# Patient Record
Sex: Male | Born: 1980 | Race: White | Hispanic: No | Marital: Single | Smoking: Current every day smoker
Health system: Southern US, Community
[De-identification: ages and names within clinical notes are randomized; demographics above are authoritative.]

## PROBLEM LIST (undated history)

## (undated) DIAGNOSIS — Z8719 Personal history of other diseases of the digestive system: Secondary | ICD-10-CM

## (undated) DIAGNOSIS — Z22322 Carrier or suspected carrier of Methicillin resistant Staphylococcus aureus: Secondary | ICD-10-CM

## (undated) DIAGNOSIS — Z8711 Personal history of peptic ulcer disease: Secondary | ICD-10-CM

## (undated) DIAGNOSIS — A4902 Methicillin resistant Staphylococcus aureus infection, unspecified site: Secondary | ICD-10-CM

## (undated) HISTORY — PX: FRACTURE SURGERY: SHX138

## (undated) HISTORY — PX: LEG SURGERY: SHX1003

## (undated) HISTORY — PX: STOMACH SURGERY: SHX791

---
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
## 2008-02-15 ENCOUNTER — Emergency Department (HOSPITAL_COMMUNITY): Admission: EM | Admit: 2008-02-15 | Discharge: 2008-02-15 | Payer: Self-pay | Admitting: Emergency Medicine

## 2008-09-05 ENCOUNTER — Emergency Department (HOSPITAL_COMMUNITY): Admission: EM | Admit: 2008-09-05 | Discharge: 2008-09-05 | Payer: Self-pay | Admitting: Emergency Medicine

## 2008-09-27 ENCOUNTER — Emergency Department (HOSPITAL_COMMUNITY): Admission: EM | Admit: 2008-09-27 | Discharge: 2008-09-27 | Payer: Self-pay | Admitting: Emergency Medicine

## 2010-10-29 ENCOUNTER — Emergency Department (HOSPITAL_COMMUNITY)
Admission: EM | Admit: 2010-10-29 | Discharge: 2010-10-29 | Disposition: A | Discharge status: Home / Self Care | Payer: Self-pay | Attending: Emergency Medicine | Admitting: Emergency Medicine

## 2010-10-29 DIAGNOSIS — L738 Other specified follicular disorders: Secondary | ICD-10-CM | POA: Insufficient documentation

## 2010-10-29 DIAGNOSIS — Z8614 Personal history of Methicillin resistant Staphylococcus aureus infection: Secondary | ICD-10-CM | POA: Insufficient documentation

## 2010-10-29 DIAGNOSIS — R21 Rash and other nonspecific skin eruption: Secondary | ICD-10-CM | POA: Insufficient documentation

## 2011-04-29 ENCOUNTER — Emergency Department (HOSPITAL_COMMUNITY)
Admission: EM | Admit: 2011-04-29 | Discharge: 2011-04-29 | Disposition: A | Discharge status: Home / Self Care | Payer: Self-pay | Attending: Emergency Medicine | Admitting: Emergency Medicine

## 2011-04-29 ENCOUNTER — Encounter: Payer: Self-pay | Admitting: Emergency Medicine

## 2011-04-29 DIAGNOSIS — K089 Disorder of teeth and supporting structures, unspecified: Secondary | ICD-10-CM | POA: Insufficient documentation

## 2011-04-29 DIAGNOSIS — R51 Headache: Secondary | ICD-10-CM | POA: Insufficient documentation

## 2011-04-29 DIAGNOSIS — F17200 Nicotine dependence, unspecified, uncomplicated: Secondary | ICD-10-CM | POA: Insufficient documentation

## 2011-04-29 DIAGNOSIS — K047 Periapical abscess without sinus: Secondary | ICD-10-CM | POA: Insufficient documentation

## 2011-04-29 DIAGNOSIS — R5381 Other malaise: Secondary | ICD-10-CM | POA: Insufficient documentation

## 2011-04-29 DIAGNOSIS — R5383 Other fatigue: Secondary | ICD-10-CM | POA: Insufficient documentation

## 2011-04-29 DIAGNOSIS — K0889 Other specified disorders of teeth and supporting structures: Secondary | ICD-10-CM

## 2011-04-29 MED ORDER — PENICILLIN V POTASSIUM 500 MG PO TABS: 500.0000 mg | ORAL_TABLET | Freq: Four times a day (QID) | ORAL | Status: AC

## 2011-04-29 MED ORDER — HYDROCODONE-ACETAMINOPHEN 5-325 MG PO TABS: 1.0000 | ORAL_TABLET | Freq: Four times a day (QID) | ORAL | Status: AC | PRN

## 2011-04-29 NOTE — ED Notes (Signed)
r side dental pain x 2 months. Worse x 2-3 days. Giving him headaches. No dental insurance in 2-3 wks.

## 2011-04-29 NOTE — ED Provider Notes (Signed)
Scribed for [name], MD, the patient was seen in room APFT22/APFT22 . This chart was scribed by Ellie Lunch. This patient's care was started at 4:57 PM.   CSN: [id number] Arrival date & time: 04/29/2011  4:39 PM  Chief Complaint  Patient presents with  . Dental Pain    (Consider location/radiation/quality/duration/timing/severity/associated sxs/prior treatment) HPI Gabriel Edwards is a 30 y.o. male who presents to the Emergency Department complaining of right upper molar dental pain starting 2 months ago and becoming progressively worse the past two days. Pain is described as constant and severe. C/o associated HA. Pain is  exacerbated by eating. Pt reports he can't eat b/c of overwhelming pain.  Denies nausea and vomiting. Pt reports he is 2-3 weeks away from dental insurance and 2-3 weeks away from seeing dentist.   History reviewed. No pertinent past medical history.  History reviewed. No pertinent past surgical history.  History reviewed. No pertinent family history.  History  Substance Use Topics  . Smoking status: Current Some Day Smoker  . Smokeless tobacco: Not on file  . Alcohol Use: No    Review of Systems  HENT: Positive for dental problem.   Neurological: Positive for weakness and headaches.  All other systems reviewed and are negative.   Allergies  Sulfa antibiotics  Home Medications   Current Outpatient Rx  Name Route Sig Dispense Refill  . IBUPROFEN 200 MG PO TABS Oral Take 200 mg by mouth as needed. For pain       BP 126/73  Pulse 65  Temp(Src) 97.5 F (36.4 C) (Oral)  Resp 16  Ht 6\' 1"  (1.854 m)  Wt 195 lb (88.451 kg)  BMI 25.73 kg/m2  SpO2 100%  Physical Exam  Nursing note and vitals reviewed. Constitutional: He is oriented to person, place, and time. He appears well-developed and well-nourished.  HENT:  Head: Normocephalic and atraumatic.  Mouth/Throat: Dental abscesses (Upper right molar abscessed and partially broken. )  present.  Eyes: Conjunctivae and EOM are normal. No scleral icterus.  Neck: Neck supple.  Cardiovascular: Normal rate and regular rhythm.   Pulmonary/Chest: Effort normal and breath sounds normal.  Abdominal: He exhibits no distension. There is no tenderness. There is no rebound.  Musculoskeletal: Normal range of motion. He exhibits no edema.  Lymphadenopathy:    He has no cervical adenopathy.  Neurological: He is oriented to person, place, and time. Coordination normal.  Skin: No rash noted. No erythema.  Psychiatric: He has a normal mood and affect. His behavior is normal.    Procedures (including critical care time)  OTHER DATA REVIEWED: Nursing notes, vital signs, and past medical records reviewed.  DIAGNOSTIC STUDIES: Oxygen Saturation is 100% on room air, normal by my interpretation.    ED COURSE /COORDINATION OF CARE: 5:05 PM EDP at PT bedside. Discussed plan to discharge with penicillin and hydrocodone.   MDM: toothache  SCRIBE ATTESTATIOn The chart was scribed for me under my direct supervision.  I personally performed the history, physical, and medical decision making and all procedures in the evaluation of this patient.[name], MD 04/29/11 [phone]

## 2011-09-07 ENCOUNTER — Encounter (HOSPITAL_COMMUNITY): Payer: Self-pay | Admitting: *Deleted

## 2011-09-07 ENCOUNTER — Emergency Department (HOSPITAL_COMMUNITY)
Admission: EM | Admit: 2011-09-07 | Discharge: 2011-09-07 | Disposition: A | Discharge status: Home / Self Care | Payer: Self-pay | Attending: Emergency Medicine | Admitting: Emergency Medicine

## 2011-09-07 DIAGNOSIS — L089 Local infection of the skin and subcutaneous tissue, unspecified: Secondary | ICD-10-CM | POA: Insufficient documentation

## 2011-09-07 DIAGNOSIS — F17200 Nicotine dependence, unspecified, uncomplicated: Secondary | ICD-10-CM | POA: Insufficient documentation

## 2011-09-07 HISTORY — DX: Methicillin resistant Staphylococcus aureus infection, unspecified site: A49.02

## 2011-09-07 MED ORDER — DOXYCYCLINE HYCLATE 100 MG PO CAPS: 100.0000 mg | ORAL_CAPSULE | Freq: Two times a day (BID) | ORAL | Status: AC

## 2011-09-07 NOTE — Discharge Instructions (Signed)
Skin infection most likely MRSA related. Take antibiotics as directed. Return for followup if not improving in 4 or 5 days.   RESOURCE GUIDE  Dental Problems  Patients with Medicaid: [facility] [phone] [address]                                           [phone] [address] Phone:  [phone]                                                  Phone:  [phone]  If unable to pay or uninsured, contact:  [facility] or [facility]. to become qualified for the adult dental clinic.  Chronic Pain Problems Contact [facility]  [phone] Patients need to be referred by their primary care doctor.  Insufficient Money for Medicine Contact United Way:  call "211" or [facility] [phone].  No Primary Care Doctor Call [facility]  [phone] Other agencies that provide inexpensive medical care    [facility]  [phone]    [facility] Medicine  [phone]    [facility]  [phone]    [facility]  [phone]    [facility]  [phone]    [facility]  [phone]  Psychological Services [facility] Behavioral Health  [phone] [facility] Services  [phone] [facility] Mental Health   [phone] (emergency services [phone])  Substance Abuse Resources [facility] Drug Services  531-384-0033 [facility] [phone] [facility] [place] [phone] Floydene Flock [phone] Residential & Outpatient Substance Abuse Program  [phone]  Abuse/Neglect [facility] Child Abuse Hotline [phone] [phone] [facility] Child Abuse Hotline 972-275-3353 (After Hours)  Emergency Shelter [facility] [phone]  Maternity Homes [facility] [place] [facility] [phone] [facility] [phone]  MRSA Hotline #:   228 345 0436    [facility] Resources  [facility] [place]     United  Way                          [facility] Dept. [address] [place]                       [address]      [address]  [place]                                                Cristobal Goldmann Phone:  [phone]                                   Phone:  [phone]  Phone:  [phone]  [facility] Mental Health Phone:  [phone]  [facility] Child Abuse Hotline 719-560-1852 [phone])732-2491 (After Hours)

## 2011-09-07 NOTE — ED Provider Notes (Signed)
History   This chart was scribed for Gabriel Jakes, MD by Gabriel Edwards . The patient was seen in room APFT21/APFT21 and the patient's care was started at 6:07pm.   CSN: [id number]  Arrival date & time 09/07/11  1708   First MD Initiated Contact with Patient 09/07/11 1743      Chief Complaint  Patient presents with  . Rash    (Consider location/radiation/quality/duration/timing/severity/associated sxs/prior treatment) HPI Gabriel Edwards is a 31 y.o. male who presents to the Emergency Department complaining of a constant, moderate rash that is scattered over is body that flared up today. Patient states that the rash started years ago and believes that the rash is a MRSA flare up. Patient states that he has been treated in the past with Doxycycline for MRSA. Patient also notes a past leg surgery for MRSA. Patient states that todays flare up is similar to his MRSA flare ups in the past. Patient reports associated itching and mild pain.    Past Medical History  Diagnosis Date  . MRSA (methicillin resistant Staphylococcus aureus)     Past Surgical History  Procedure Date  . Leg surgery     for MRSA    History reviewed. No pertinent family history.  History  Substance Use Topics  . Smoking status: Current Some Day Smoker  . Smokeless tobacco: Not on file  . Alcohol Use: No      Review of Systems  Constitutional: Negative for fever.  HENT: Negative for congestion, sore throat, rhinorrhea and neck pain.   Respiratory: Negative for cough and shortness of breath.   Cardiovascular: Negative for chest pain and leg swelling.  Gastrointestinal: Negative for nausea, vomiting, abdominal pain and diarrhea.  Musculoskeletal: Negative for back pain.       Minor foot swelling  Skin: Positive for rash.  Neurological: Positive for light-headedness. Negative for headaches.    Allergies  Sulfa antibiotics  Home Medications   Current Outpatient Rx  Name Route Sig  Dispense Refill  . DOXYCYCLINE HYCLATE 100 MG PO CAPS Oral Take 1 capsule (100 mg total) by mouth 2 (two) times daily. 14 capsule 0  . IBUPROFEN 200 MG PO TABS Oral Take 200 mg by mouth as needed. For pain       BP 129/77  Pulse 73  Temp(Src) 97.3 F (36.3 C) (Oral)  Resp 16  Ht 6\' 1"  (1.854 m)  Wt 195 lb (88.451 kg)  BMI 25.73 kg/m2  SpO2 100%  Physical Exam  Nursing note and vitals reviewed. Constitutional: He is oriented to person, place, and time. He appears well-developed and well-nourished. No distress.  HENT:  Head: Normocephalic and atraumatic.  Eyes: EOM are normal. Pupils are equal, round, and reactive to light.  Neck: Neck supple. No tracheal deviation present.  Cardiovascular: Normal rate, regular rhythm and normal heart sounds.  Exam reveals no gallop and no friction rub.   No murmur heard. Pulmonary/Chest: Effort normal and breath sounds normal. No respiratory distress. He has no wheezes. He has no rales.  Abdominal: Soft. Bowel sounds are normal. He exhibits no distension.  Musculoskeletal: Normal range of motion. He exhibits no edema.  Neurological: He is alert and oriented to person, place, and time. No sensory deficit.  Skin: Skin is warm and dry. Rash noted.       Scattered palpuls with scabs that are all over the body, concentrated on the chin bilaterally and hands. Some noted on the legs, chest and back.   Psychiatric:  He has a normal mood and affect. His behavior is normal.    ED Course  Procedures (including critical care time)  DIAGNOSTIC STUDIES: Oxygen Saturation is 100% on room air, normal by my interpretation.    COORDINATION OF CARE:     Labs Reviewed - No data to display No results found.   1. Skin infection       MDM  Folliculitis skin infection consistent with MRSA. Nontoxic in no acute distress. History of similar infections in the past treated with doxycycline.  I personally performed the services described in this  documentation, which was scribed in my presence. The recorded information has been reviewed and considered.         Gabriel Jakes, MD 09/07/11 Gabriel Edwards

## 2011-09-07 NOTE — ED Notes (Signed)
Pt a/ox4. Resp even and unlabored. NAD at this time. D/C instructions reviewed with pt. Pt verbalized understanding. Pt ambulated to lobby with steady gate.  

## 2011-09-07 NOTE — ED Notes (Signed)
Pt has rash all over body. Pt states that he is having a MRSA flare up. States that he has been treated in the past.

## 2011-12-01 ENCOUNTER — Encounter (HOSPITAL_COMMUNITY): Payer: Self-pay | Admitting: *Deleted

## 2011-12-01 ENCOUNTER — Emergency Department (HOSPITAL_COMMUNITY)
Admission: EM | Admit: 2011-12-01 | Discharge: 2011-12-01 | Disposition: A | Discharge status: Home / Self Care | Payer: Self-pay | Attending: Emergency Medicine | Admitting: Emergency Medicine

## 2011-12-01 DIAGNOSIS — M7989 Other specified soft tissue disorders: Secondary | ICD-10-CM | POA: Insufficient documentation

## 2011-12-01 DIAGNOSIS — W260XXA Contact with knife, initial encounter: Secondary | ICD-10-CM | POA: Insufficient documentation

## 2011-12-01 DIAGNOSIS — M79609 Pain in unspecified limb: Secondary | ICD-10-CM | POA: Insufficient documentation

## 2011-12-01 DIAGNOSIS — L03114 Cellulitis of left upper limb: Secondary | ICD-10-CM

## 2011-12-01 DIAGNOSIS — L03019 Cellulitis of unspecified finger: Secondary | ICD-10-CM | POA: Insufficient documentation

## 2011-12-01 DIAGNOSIS — L02519 Cutaneous abscess of unspecified hand: Secondary | ICD-10-CM | POA: Insufficient documentation

## 2011-12-01 DIAGNOSIS — S61209A Unspecified open wound of unspecified finger without damage to nail, initial encounter: Secondary | ICD-10-CM | POA: Insufficient documentation

## 2011-12-01 MED ORDER — BACITRACIN ZINC 500 UNIT/GM EX OINT
TOPICAL_OINTMENT | CUTANEOUS | Status: AC
  Administered 2011-12-01: 1
  Filled 2011-12-01: qty 0.9

## 2011-12-01 MED ORDER — DOXYCYCLINE HYCLATE 100 MG PO TABS
200.0000 mg | ORAL_TABLET | Freq: Once | ORAL | Status: AC
  Administered 2011-12-01: 200 mg via ORAL
  Filled 2011-12-01: qty 2

## 2011-12-01 MED ORDER — TETANUS-DIPHTH-ACELL PERTUSSIS 5-2.5-18.5 LF-MCG/0.5 IM SUSP
0.5000 mL | Freq: Once | INTRAMUSCULAR | Status: AC
  Administered 2011-12-01: 0.5 mL via INTRAMUSCULAR
  Filled 2011-12-01: qty 0.5

## 2011-12-01 MED ORDER — DOXYCYCLINE HYCLATE 100 MG PO CAPS: 100.0000 mg | ORAL_CAPSULE | Freq: Two times a day (BID) | ORAL | Status: AC

## 2011-12-01 MED ORDER — CEFTRIAXONE SODIUM 1 G IJ SOLR
1.0000 g | Freq: Once | INTRAMUSCULAR | Status: AC
  Administered 2011-12-01: 1 g via INTRAMUSCULAR
  Filled 2011-12-01: qty 10

## 2011-12-01 MED ORDER — IBUPROFEN 800 MG PO TABS
800.0000 mg | ORAL_TABLET | Freq: Once | ORAL | Status: AC
  Administered 2011-12-01: 800 mg via ORAL
  Filled 2011-12-01: qty 1

## 2011-12-01 NOTE — ED Notes (Signed)
Pt a/ox4. Resp even and unlabored. NAD at this time. D/C instructions reviewed with pt. Pt verbalized understanding. Pt ambulated to lobby with steady gate.  

## 2011-12-01 NOTE — ED Provider Notes (Signed)
History     CSN: [id number]  Arrival date & time 12/01/11  1903   First MD Initiated Contact with Patient 12/01/11 2056      Chief Complaint  Patient presents with  . Wound Infection    (Consider location/radiation/quality/duration/timing/severity/associated sxs/prior treatment) HPI Comments: Pt nicked R 3rd knuckle with a utility knife 2 days ago.  Now red, swollen and painful.  The history is provided by the patient. No language interpreter was used.    Past Medical History  Diagnosis Date  . MRSA (methicillin resistant Staphylococcus aureus)     Past Surgical History  Procedure Date  . Leg surgery     for MRSA    No family history on file.  History  Substance Use Topics  . Smoking status: Current Some Day Smoker  . Smokeless tobacco: Not on file  . Alcohol Use: No      Review of Systems  Constitutional: Negative for fever and chills.  Skin: Positive for wound.  All other systems reviewed and are negative.    Allergies  Sulfa antibiotics  Home Medications   Current Outpatient Rx  Name Route Sig Dispense Refill  . DOXYCYCLINE HYCLATE 100 MG PO CAPS Oral Take 1 capsule (100 mg total) by mouth 2 (two) times daily. 20 capsule 0  . IBUPROFEN 200 MG PO TABS Oral Take 200 mg by mouth as needed. For pain       BP 134/79  Pulse 66  Temp 98 F (36.7 C)  Resp 20  Ht 6\' 1"  (1.854 m)  Wt 210 lb (95.255 kg)  BMI 27.71 kg/m2  SpO2 100%  Physical Exam  Nursing note and vitals reviewed. Constitutional: He is oriented to person, place, and time. He appears well-developed and well-nourished.  HENT:  Head: Normocephalic and atraumatic.  Eyes: EOM are normal.  Neck: Normal range of motion.  Cardiovascular: Normal rate, regular rhythm, normal heart sounds and intact distal pulses.   Pulmonary/Chest: Effort normal and breath sounds normal. No respiratory distress.  Abdominal: Soft. He exhibits no distension. There is no tenderness.  Musculoskeletal: He  exhibits tenderness.       Left hand: He exhibits decreased range of motion, tenderness, laceration and swelling. He exhibits no bony tenderness, normal capillary refill and no deformity. normal sensation noted. Normal strength noted.       Hands: Neurological: He is alert and oriented to person, place, and time.  Skin: Skin is warm and dry.  Psychiatric: He has a normal mood and affect. Judgment normal.    ED Course  Procedures (including critical care time)  Labs Reviewed - No data to display No results found.   1. Cellulitis of left hand       MDM  Wash BID/abx ointment.  Return to ED in 2 days for re-check. rx-doxycycline, [address], [place] 12/01/11 2131  Worthy Rancher, PA 12/01/11 2138

## 2011-12-01 NOTE — ED Notes (Signed)
Pt reports he cut his left 2nd/3rd nuckel with a razor blade 2 days ago, now c/o pain and swelling to area

## 2011-12-01 NOTE — Discharge Instructions (Signed)
Cellulitis Cellulitis is an infection of the tissue under the skin. The infected area is usually red and tender. This is caused by germs. These germs enter the body through cuts or sores. This usually happens in the arms or lower legs. HOME CARE   Take your medicine as told. Finish it even if you start to feel better.   If the infection is on the arm or leg, keep it raised (elevated).   Use a warm cloth on the infected area several times a day.   See your doctor for a follow-up visit as told.  GET HELP RIGHT AWAY IF:   You are tired or confused.   You throw up (vomit).   You have watery poop (diarrhea).   You feel ill and have muscle aches.   You have a fever.  MAKE SURE YOU:   Understand these instructions.   Will watch your condition.   Will get help right away if you are not doing well or get worse.  Document Released: 12/25/2007 Document Revised: 06/27/2011 Document Reviewed: 06/09/2009 [facility] Patient Information 2012 [place], [place].   Take the antibiotic as directed.  Return to the ED in 2 days for a re-check.

## 2011-12-02 NOTE — ED Provider Notes (Signed)
Medical screening examination/treatment/procedure(s) were performed by non-physician practitioner and as supervising physician I was immediately available for consultation/collaboration.   Carleene Cooper III, MD 12/02/11 2114

## 2011-12-04 ENCOUNTER — Encounter (HOSPITAL_COMMUNITY): Payer: Self-pay | Admitting: *Deleted

## 2011-12-04 ENCOUNTER — Emergency Department (HOSPITAL_COMMUNITY)
Admission: EM | Admit: 2011-12-04 | Discharge: 2011-12-04 | Disposition: A | Discharge status: Home / Self Care | Payer: Self-pay | Attending: Emergency Medicine | Admitting: Emergency Medicine

## 2011-12-04 DIAGNOSIS — Z09 Encounter for follow-up examination after completed treatment for conditions other than malignant neoplasm: Secondary | ICD-10-CM | POA: Insufficient documentation

## 2011-12-04 DIAGNOSIS — Z5189 Encounter for other specified aftercare: Secondary | ICD-10-CM

## 2011-12-04 NOTE — ED Notes (Signed)
PT is here today for a referral to infectious DX. For treatment of MRSA.

## 2011-12-04 NOTE — ED Provider Notes (Signed)
History     CSN: [id number]  Arrival date & time 12/04/11  4098   First MD Initiated Contact with Patient 12/04/11 [phone]      Chief Complaint  Patient presents with  . Wound Infection    MRSA    (Consider location/radiation/quality/duration/timing/severity/associated sxs/prior treatment) Patient is a 31 y.o. male presenting with wound check. The history is provided by the patient.  Wound Check  He was treated in the ED 3 to 5 days ago. Previous treatment in the ED includes wound cleansing or irrigation and oral antibiotics. Treatments since wound repair include oral antibiotics and regular soap and water washings. Fever duration: none. There has been no drainage from the wound. The redness has improved. The swelling has improved. The pain has improved. He has no difficulty moving the affected extremity or digit.   Pt presents for wound recheck to L hand. Was seen at [facility] on Sun for this and started on doxy. Has been taking as prescribed and states appearance is much improved. He states he has had MRSA several times in the past and wants to discuss eradication and possibly get ID referral. States he had to quit his job working for a [employer] with this infection as the hand was so swollen and painful that he could not work; concerned about missing more work in the future due to re-infection.  Past Medical History  Diagnosis Date  . MRSA (methicillin resistant Staphylococcus aureus)     Past Surgical History  Procedure Date  . Leg surgery     for MRSA    No family history on file.  History  Substance Use Topics  . Smoking status: Current Some Day Smoker  . Smokeless tobacco: Not on file  . Alcohol Use: No      Review of Systems  Constitutional: Negative for fever and chills.  Musculoskeletal: Negative for myalgias and arthralgias.  Skin: Positive for wound. Negative for color change.  Neurological: Negative for weakness and numbness.    Allergies  Sulfa  antibiotics  Home Medications   Current Outpatient Rx  Name Route Sig Dispense Refill  . DOXYCYCLINE HYCLATE 100 MG PO CAPS Oral Take 1 capsule (100 mg total) by mouth 2 (two) times daily. 20 capsule 0  . IBUPROFEN 200 MG PO TABS Oral Take 200-400 mg by mouth every 6 (six) hours as needed. For pain    . NAPROXEN SODIUM 220 MG PO TABS Oral Take 440 mg by mouth 2 (two) times daily as needed. For pain      BP 141/76  Pulse 90  Temp(Src) 98.8 F (37.1 C) (Oral)  Resp 18  SpO2 94%  Physical Exam  Nursing note and vitals reviewed. Constitutional: He appears well-developed and well-nourished. No distress.  HENT:  Head: Normocephalic and atraumatic.  Neck: Normal range of motion.  Cardiovascular: Normal rate.   Pulmonary/Chest: Effort normal.  Musculoskeletal: Normal range of motion.       L hand: FROM, no TTP or swelling. Tiny well healing, scabbed laceration to dorsum MCP. No surrounding erythema or streaking.  Neurological: He is alert.  Skin: Skin is warm and dry. He is not diaphoretic.  Psychiatric: He has a normal mood and affect.    ED Course  Procedures (including critical care time)  Labs Reviewed - No data to display No results found.   1. Visit for wound check       MDM  Pt appears clinically improved today. Advised to cont doxy until gone. Discussed  typical mechanisms, etc of MRSA colonization. Referral to ID given although advised him that eradication is not 100% effective. Pt verbalized understanding, agreed to plan.       Grant Fontana, [place] 12/04/11 1007

## 2011-12-04 NOTE — Discharge Instructions (Signed)
Please continue the doxycyline until it is gone. Make a follow up appointment with infectious disease. Return to the ER with increased redness, pain, swelling, fever, or any other worrisome symptoms.  RESOURCE GUIDE  Dental Problems  Patients with Medicaid: [facility] [phone] [address]                                           [phone] Phone:  [phone]                                                  Phone:  [phone]  If unable to pay or uninsured, contact:  [facility] or [facility]. to become qualified for the adult dental clinic.  Chronic Pain Problems Contact [facility]  [phone] Patients need to be referred by their primary care doctor.  Insufficient Money for Medicine Contact United Way:  call "211" or [facility] [phone].  No Primary Care Doctor Call [facility]  [phone] Other agencies that provide inexpensive medical care    [facility]  [phone]    [facility] Internal Medicine  [phone]    [facility]  [phone]    [facility]  [phone]    [facility]  [phone]    [facility] Child Clinic  [phone]  Psychological Services [facility] Behavioral Health  [phone] [facility] Services  [phone] [facility] Mental Health   [phone] (emergency services [phone])  Substance Abuse Resources Alcohol and Drug Services  [phone] [facility] [phone] [facility] [place] [phone] Floydene Flock [phone] Residential & Outpatient Substance Abuse Program  [phone]  Abuse/Neglect [facility] Child Abuse Hotline 409 147 0105 [facility] Child Abuse Hotline 650-803-5867 (After Hours)  Emergency Shelter [facility] Ministries [phone]  Maternity Homes [facility] [place] [facility] [phone] [facility] [phone]  MRSA Hotline #:    936-676-4087    [facility] Resources  [facility] [place]     United Way                          [facility] Dept. [address] [place]                       [address]      [address]  [place]                                                Cristobal Goldmann Phone:  [phone]  Phone:  [phone]                 Phone:  [phone]  [facility] Mental Health Phone:  [phone]  [facility] Child Abuse Hotline 213 409 [phone] (After Hours)  Wound Check Your wound appears healthy today. Your wound will heal gradually over time. Eventually a scar will form that will fade with time. FACTORS THAT AFFECT SCAR FORMATION:  People differ in the severity in which they scar.   Scar severity varies according to location, size, and the traits you inherited from your parents (genetic predisposition).   Irritation to the wound from infection, rubbing, or chemical exposure will increase the amount of scar formation.  HOME CARE INSTRUCTIONS   If you were given a dressing, you should change it at least once a day or as instructed by your caregiver. If the bandage sticks, soak it off with a solution of hydrogen peroxide.   If the bandage becomes wet, dirty, or develops a bad smell, change it as soon as possible.   Look for signs of infection.   Only take over-the-counter or prescription medicines for pain, discomfort, or fever as directed by your caregiver.  SEEK IMMEDIATE MEDICAL CARE IF:   You have redness, swelling, or increasing pain in the wound.   You notice pus coming from the wound.   You have a fever.   You notice a bad smell coming from the wound or dressing.  Document Released: 04/13/2004 Document Revised: 06/27/2011 Document Reviewed: 07/08/2005 [facility] Patient Information 2012 [place], [place].

## 2011-12-05 ENCOUNTER — Encounter: Payer: Self-pay | Admitting: Internal Medicine

## 2011-12-05 ENCOUNTER — Ambulatory Visit (INDEPENDENT_AMBULATORY_CARE_PROVIDER_SITE_OTHER): Payer: Self-pay | Admitting: Internal Medicine

## 2011-12-05 VITALS — BP 120/79 | HR 86 | Temp 97.6°F | Wt 228.0 lb

## 2011-12-05 DIAGNOSIS — Z22322 Carrier or suspected carrier of Methicillin resistant Staphylococcus aureus: Secondary | ICD-10-CM

## 2011-12-05 DIAGNOSIS — L089 Local infection of the skin and subcutaneous tissue, unspecified: Secondary | ICD-10-CM

## 2011-12-05 MED ORDER — MUPIROCIN CALCIUM 2 % EX CREA: TOPICAL_CREAM | Freq: Three times a day (TID) | CUTANEOUS | Status: AC

## 2011-12-05 MED ORDER — RIFAMPIN 300 MG PO CAPS: 300.0000 mg | ORAL_CAPSULE | Freq: Two times a day (BID) | ORAL | Status: AC

## 2011-12-05 MED ORDER — SULFAMETHOXAZOLE-TMP DS 800-160 MG PO TABS: 1.0000 | ORAL_TABLET | Freq: Two times a day (BID) | ORAL | Status: AC

## 2011-12-05 MED ORDER — CHLORHEXIDINE GLUCONATE 4 % EX LIQD: 60.0000 mL | Freq: Every day | CUTANEOUS | Status: DC | PRN

## 2011-12-05 MED ORDER — CHLORHEXIDINE GLUCONATE 4 % EX LIQD: 60.0000 mL | Freq: Every day | CUTANEOUS | Status: AC | PRN

## 2011-12-05 NOTE — ED Provider Notes (Signed)
Medical screening examination/treatment/procedure(s) were performed by non-physician practitioner and as supervising physician I was immediately available for consultation/collaboration.   Laray Anger, DO 12/05/11 1023

## 2011-12-05 NOTE — Progress Notes (Signed)
INFECTIOUS DISEASES CLINIC  RFV: recurrent MRSA skin infections Subjective:    Patient ID: Gabriel Edwards, male    DOB: [birth year], 31 y.o.   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 He is referred for decolonization. Some of his lesions have had i x d, usually respond to antibiotics. Currently on doxycycline for current outbreak.  No current outpatient prescriptions on file prior to visit.   Active Ambulatory Problems    Diagnosis Date Noted  . No Active Ambulatory Problems   Resolved Ambulatory Problems    Diagnosis Date Noted  . No Resolved Ambulatory Problems   Past Medical History  Diagnosis Date  . MRSA (methicillin resistant Staphylococcus aureus)   . MRSA (methicillin resistant staph aureus) culture positive        Review of Systems  Constitutional: Negative for fever, chills, diaphoresis, activity change, appetite change, fatigue and unexpected weight change.  HENT: Negative for congestion, sore throat, rhinorrhea, sneezing, trouble swallowing and sinus pressure.  Eyes: Negative for photophobia and visual disturbance.  Respiratory: Negative for cough, chest tightness, shortness of breath, wheezing and stridor.  Cardiovascular: Negative for chest pain, palpitations and leg swelling.  Gastrointestinal: Negative for nausea, vomiting, abdominal pain, diarrhea, constipation, blood in stool, abdominal distention and anal bleeding.  Genitourinary: Negative for dysuria, hematuria, flank pain and difficulty urinating.  Musculoskeletal: Negative for myalgias, back pain, joint swelling, arthralgias and gait problem.  Skin: per hpi Neurological: Negative for dizziness, tremors, weakness and light-headedness.  Hematological: Negative for adenopathy. Does not bruise/bleed easily.  Psychiatric/Behavioral: Negative for behavioral problems, confusion, sleep disturbance, dysphoric mood, decreased concentration and  agitation.       Objective:   Physical Exam  BP 120/79  Pulse 86  Temp 97.6 F (36.4 C)  Wt 228 lb (103.42 kg) Physical Exam  Constitutional: He is oriented to person, place, and time. He appears well-developed and well-nourished. No distress.  HENT:  Mouth/Throat: Oropharynx is clear and moist. No oropharyngeal exudate.  Cardiovascular: Normal rate, regular rhythm and normal heart sounds. Exam reveals no gallop and no friction rub.  No murmur heard.  Pulmonary/Chest: Effort normal and breath sounds normal. No respiratory distress. He has no wheezes.  Abdominal: Soft. Bowel sounds are normal. He exhibits no distension. There is no tenderness.  Lymphadenopathy:  He has no cervical adenopathy.  Neurological: He is alert and oriented to person, place, and time.  Skin: Scattered skin lesions of scarring, 1-2 indurated carbuncles Psychiatric: He has a normal mood and affect. His behavior is normal.         Assessment & Plan:  mrsa decolonization = will ask patient to finish his current course of doxycycline and follow up with decolonization protocol -> bactrim DS 1 tab BID, rifampin 300mg  BID, mupirocin TID intranasal, and hibiclens body wash.

## 2011-12-30 ENCOUNTER — Emergency Department (HOSPITAL_COMMUNITY)
Admission: EM | Admit: 2011-12-30 | Discharge: 2011-12-30 | Disposition: A | Discharge status: Home / Self Care | Payer: Self-pay | Attending: Emergency Medicine | Admitting: Emergency Medicine

## 2011-12-30 ENCOUNTER — Encounter (HOSPITAL_COMMUNITY): Payer: Self-pay

## 2011-12-30 DIAGNOSIS — F17200 Nicotine dependence, unspecified, uncomplicated: Secondary | ICD-10-CM | POA: Insufficient documentation

## 2011-12-30 DIAGNOSIS — K047 Periapical abscess without sinus: Secondary | ICD-10-CM | POA: Insufficient documentation

## 2011-12-30 DIAGNOSIS — Z8614 Personal history of Methicillin resistant Staphylococcus aureus infection: Secondary | ICD-10-CM | POA: Insufficient documentation

## 2011-12-30 DIAGNOSIS — K029 Dental caries, unspecified: Secondary | ICD-10-CM | POA: Insufficient documentation

## 2011-12-30 HISTORY — DX: Carrier or suspected carrier of methicillin resistant Staphylococcus aureus: Z22.322

## 2011-12-30 MED ORDER — OXYCODONE-ACETAMINOPHEN 5-325 MG PO TABS
1.0000 | ORAL_TABLET | Freq: Once | ORAL | Status: AC
  Administered 2011-12-30: 1 via ORAL
  Filled 2011-12-30: qty 1

## 2011-12-30 MED ORDER — PERCOCET 5-325 MG PO TABS: ORAL_TABLET | ORAL | Status: DC

## 2011-12-30 MED ORDER — CLINDAMYCIN HCL 150 MG PO CAPS
300.0000 mg | ORAL_CAPSULE | Freq: Once | ORAL | Status: AC
  Administered 2011-12-30: 300 mg via ORAL
  Filled 2011-12-30: qty 2

## 2011-12-30 MED ORDER — CLINDAMYCIN HCL 150 MG PO CAPS: 300.0000 mg | ORAL_CAPSULE | Freq: Four times a day (QID) | ORAL | Status: AC

## 2011-12-30 NOTE — Discharge Instructions (Signed)
Look at the information about the "[facility]" to see when they are going to be in [place]. Take the antibiotic until gone. Take the percocet for pain. Recheck if you are getting worse instead of better.   Abscessed Tooth An abscessed tooth is an infection around your tooth. It may be caused by holes or damage to the tooth (cavity) or a dental disease. An abscessed tooth causes mild to very bad pain in and around the tooth. See your dentist right away if you have tooth or gum pain. HOME CARE  Take your medicine as told. Finish it even if you start to feel better.   Do not drive after taking pain medicine.   Rinse your mouth (gargle) often with salt water ( teaspoon salt in 8 ounces of warm water).   Do not apply heat to the outside of your face.  GET HELP RIGHT AWAY IF:   You have a temperature by mouth above 102 F (38.9 C), not controlled by medicine.   You have chills and a very bad headache.   You have problems breathing or swallowing.   Your mouth will not open.   You develop puffiness (swelling) on the neck or around the eye.   Your pain is not helped by medicine.   Your pain is getting worse instead of better.  MAKE SURE YOU:   Understand these instructions.   Will watch your condition.   Will get help right away if you are not doing well or get worse.  Document Released: 12/25/2007 Document Revised: 06/27/2011 Document Reviewed: 10/16/2010 [facility] Patient Information 2012 [place], [place].

## 2011-12-30 NOTE — ED Provider Notes (Signed)
History  This chart was scribed for Ward Givens, MD by Bennett Scrape. This patient was seen in room APA07/APA07 and the patient's care was started at 3:27PM.  CSN: [id number]  Arrival date & time 12/30/11  1320   First MD Initiated Contact with Patient 12/30/11 1527      Chief Complaint  Patient presents with  . Dental Pain    Patient is a 31 y.o. male presenting with tooth pain. The history is provided by the patient. No language interpreter was used.  Dental PainThe symptoms began yesterday. The symptoms are worsening. The symptoms are recurrent. The symptoms occur constantly.  Medical issues include: smoking.    CALLEN VANCUREN is a 31 y.o. male who presents to the Emergency Department complaining of 2 days of gradual onset, gradually worsening, constant right-sided dental pain described as an abscessed tooth with associated right sided jaw swelling. The pain is non-radiating. Pt reports prior episodes of similar dental pain and states that this has been an ongoing problem for the past 1 to 2 years. He states that he just finished Bactrim DS 2 or 3 days ago for recurrent MRSA. He reports that he has tried other antibiotics without long-term improvement as well for the MRSA. He has not been to see a dentist for the problem. He denies fever, sore throat, and trouble swallowing as associated symptoms. He is a current everyday smoker but denies alcohol use.  Pt works as an Lawyer. He has no current PCP. No dentist  Past Medical History  Diagnosis Date  . MRSA (methicillin resistant Staphylococcus aureus)   . MRSA (methicillin resistant staph aureus) culture positive     Past Surgical History  Procedure Date  . Leg surgery     for MRSA    No family history on file.  History  Substance Use Topics  . Smoking status: Current Some Day Smoker-one pack/.day  . Smokeless tobacco: Not on file  . Alcohol Use: No   employed   Review of Systems  Constitutional:         10 Systems reviewed and are negative for acute change except as noted in the HPI.  All other systems reviewed and are negative.    Allergies  Sulfa antibiotics-pt states that this was a childhood allergy  Home Medications   Current Outpatient Rx  Name Route Sig Dispense Refill  . SULFAMETHOXAZOLE-TMP DS 800-160 MG PO TABS Oral Take 2 tablets by mouth 2 (two) times daily.      Triage Vitals: BP 123/75  Pulse 79  Temp(Src) 98.2 F (36.8 C) (Oral)  Resp 18  Ht 6\' 1"  (1.854 m)  Wt 225 lb (102.059 kg)  BMI 29.69 kg/m2  SpO2 100%  Physical Exam  Nursing note and vitals reviewed. Constitutional: He is oriented to person, place, and time. He appears well-developed and well-nourished. No distress.  HENT:  Head: Normocephalic and atraumatic.  Right Ear: External ear normal.  Left Ear: External ear normal.  Mouth/Throat: Oropharynx is clear and moist.       Facial swelling on the right; firm to touch, no erythema, diffuse dental caries and disease with some swelling of the gum; the swelling doesn't extend to the soft pallet, 5th tooth from right upper middle incisor is rotted to the gumline and is the one he states is painful with some redness and swelling orf the hum.   Eyes: Conjunctivae and EOM are normal. Pupils are equal, round, and reactive to light.  Neck: Normal  range of motion. Neck supple. No tracheal deviation present.  Pulmonary/Chest: Effort normal. No respiratory distress.  Musculoskeletal: Normal range of motion.  Neurological: He is alert and oriented to person, place, and time.  Skin: Skin is warm and dry.  Psychiatric: He has a normal mood and affect. His behavior is normal.    ED Course  Procedures (including critical care time)   Medications  clindamycin (CLEOCIN) capsule 300 mg (300 mg Oral Given 12/30/11 1602)  oxyCODONE-acetaminophen (PERCOCET) 5-325 MG per tablet 1 tablet (1 tablet Oral Given 12/30/11 1602)   Pt given option for IV ATBS and pain  meds or just oral and he chose oral.   DIAGNOSTIC STUDIES: Oxygen Saturation is 100% on room air, normal by my interpretation.    COORDINATION OF CARE: 3:38PM-Discussed treatment plan of antibiotics and pain medications with pt and pt agreed to plan. Will provide a referral to a dental clinic.     1. Dental abscess     New Prescriptions   CLINDAMYCIN (CLEOCIN) 150 MG CAPSULE    Take 2 capsules (300 mg total) by mouth every 6 (six) hours.   PERCOCET 5-325 MG PER TABLET    Take 1 or 2 po Q 6hrs for pain    Plan discharge  Devoria Albe, MD, FACEP   MDM    I personally performed the services described in this documentation, which was scribed in my presence. The recorded information has been reviewed and considered.  Devoria Albe, MD, Armando Gang       Ward Givens, MD 12/30/11 [phone]

## 2011-12-30 NOTE — ED Notes (Signed)
Pt c/o toothache since yesterday afternoon.  Facial swelling noted.

## 2013-05-11 ENCOUNTER — Encounter (HOSPITAL_COMMUNITY): Payer: Self-pay | Admitting: Emergency Medicine

## 2013-05-11 ENCOUNTER — Emergency Department (HOSPITAL_COMMUNITY)
Admission: EM | Admit: 2013-05-11 | Discharge: 2013-05-11 | Disposition: A | Discharge status: Home / Self Care | Payer: Self-pay | Attending: Emergency Medicine | Admitting: Emergency Medicine

## 2013-05-11 DIAGNOSIS — Z79899 Other long term (current) drug therapy: Secondary | ICD-10-CM | POA: Insufficient documentation

## 2013-05-11 DIAGNOSIS — K029 Dental caries, unspecified: Secondary | ICD-10-CM | POA: Insufficient documentation

## 2013-05-11 DIAGNOSIS — Z8614 Personal history of Methicillin resistant Staphylococcus aureus infection: Secondary | ICD-10-CM | POA: Insufficient documentation

## 2013-05-11 DIAGNOSIS — K0889 Other specified disorders of teeth and supporting structures: Secondary | ICD-10-CM

## 2013-05-11 DIAGNOSIS — K089 Disorder of teeth and supporting structures, unspecified: Secondary | ICD-10-CM | POA: Insufficient documentation

## 2013-05-11 DIAGNOSIS — R51 Headache: Secondary | ICD-10-CM | POA: Insufficient documentation

## 2013-05-11 DIAGNOSIS — F17200 Nicotine dependence, unspecified, uncomplicated: Secondary | ICD-10-CM | POA: Insufficient documentation

## 2013-05-11 MED ORDER — TRAMADOL HCL 50 MG PO TABS: ORAL_TABLET | ORAL | Status: DC

## 2013-05-11 MED ORDER — AMOXICILLIN 500 MG PO CAPS: 500.0000 mg | ORAL_CAPSULE | Freq: Three times a day (TID) | ORAL | Status: DC

## 2013-05-11 MED ORDER — MELOXICAM 7.5 MG PO TABS: ORAL_TABLET | ORAL | Status: DC

## 2013-05-11 NOTE — ED Notes (Signed)
Patient to Ed c/o right sided dental pain. Patient states he has an abscess on the right side which is causing right sided jaw pain. Right side of face is swollen. Patient states he can't eat anything. When eating 10/10 pain sharp shooting pain

## 2013-05-11 NOTE — ED Provider Notes (Signed)
CSN: [id number]     Arrival date & time 05/11/13  1141 History   First MD Initiated Contact with Patient 05/11/13 1314     Chief Complaint  Patient presents with  . Dental Pain   (Consider location/radiation/quality/duration/timing/severity/associated sxs/prior Treatment) Patient is a 32 y.o. male presenting with tooth pain. The history is provided by the patient.  Dental Pain Location:  Upper Upper teeth location:  3/RU 1st molar Quality:  Aching and throbbing Severity:  Moderate Onset quality:  Gradual Duration:  4 days Timing:  Intermittent Progression:  Worsening Chronicity:  Chronic Context: dental caries and poor dentition   Relieved by:  Nothing Worsened by:  Touching Associated symptoms: facial pain, gum swelling and headaches   Associated symptoms: no difficulty swallowing and no neck pain     Past Medical History  Diagnosis Date  . MRSA (methicillin resistant Staphylococcus aureus)   . MRSA (methicillin resistant staph aureus) culture positive    Past Surgical History  Procedure Laterality Date  . Leg surgery      for MRSA   No family history on file. History  Substance Use Topics  . Smoking status: Current Some Day Smoker -- 1.00 packs/day    Types: Cigarettes  . Smokeless tobacco: Not on file  . Alcohol Use: No    Review of Systems  Constitutional: Negative for activity change.       All ROS Neg except as noted in HPI  HENT: Positive for dental problem. Negative for nosebleeds.   Eyes: Negative for photophobia and discharge.  Respiratory: Negative for cough, shortness of breath and wheezing.   Cardiovascular: Negative for chest pain and palpitations.  Gastrointestinal: Negative for abdominal pain and blood in stool.  Genitourinary: Negative for dysuria, frequency and hematuria.  Musculoskeletal: Negative for arthralgias, back pain and neck pain.  Skin: Negative.   Neurological: Positive for headaches. Negative for dizziness, seizures and speech  difficulty.  Psychiatric/Behavioral: Negative for hallucinations and confusion.    Allergies  Sulfa antibiotics  Home Medications   Current Outpatient Rx  Name  Route  Sig  Dispense  Refill  . Multiple Vitamin (MULTIVITAMIN WITH MINERALS) TABS tablet   Oral   Take 1 tablet by mouth daily.         . naproxen sodium (ALEVE) 220 MG tablet   Oral   Take 220 mg by mouth 2 (two) times daily as needed (Pain).          BP 143/79  Pulse 68  Temp(Src) 98.1 F (36.7 C) (Oral)  Resp 18  SpO2 100% Physical Exam  Nursing note and vitals reviewed. Constitutional: He is oriented to person, place, and time. He appears well-developed and well-nourished.  Non-toxic appearance.  HENT:  Head: Normocephalic.  Right Ear: Tympanic membrane and external ear normal.  Left Ear: Tympanic membrane and external ear normal.  Multiple dental caries present. Right upper 1st molar decayed to the gum line. Mod swelling of the gum on the upper right.  Eyes: EOM and lids are normal. Pupils are equal, round, and reactive to light.  Neck: Normal range of motion. Neck supple. Carotid bruit is not present.  Cardiovascular: Normal rate, regular rhythm, normal heart sounds, intact distal pulses and normal pulses.   Pulmonary/Chest: Breath sounds normal. No respiratory distress.  Abdominal: Soft. Bowel sounds are normal. There is no tenderness. There is no guarding.  Musculoskeletal: Normal range of motion.  Lymphadenopathy:       Head (right side): No submandibular adenopathy present.  Head (left side): No submandibular adenopathy present.    He has no cervical adenopathy.  Neurological: He is alert and oriented to person, place, and time. He has normal strength. No cranial nerve deficit or sensory deficit.  Skin: Skin is warm and dry.  Psychiatric: He has a normal mood and affect. His speech is normal.    ED Course  Procedures (including critical care time) Labs Review Labs Reviewed - No data to  display Imaging Review No results found.  EKG Interpretation   None       MDM  No diagnosis found. *I have reviewed nursing notes, vital signs, and all appropriate lab and imaging results for this patient.**  Rx for amoxil, mobic, and tramadol given to the patient. Dental resource guide given to the patient.  Kathie Dike, PA-C 05/11/13 2056

## 2013-05-11 NOTE — ED Notes (Signed)
Pt upset that he did not get a prescription for narcotics, states "this was a waste of time"

## 2013-05-11 NOTE — ED Notes (Signed)
Pt has not able to follow up with dentist since last visit due to no job

## 2013-05-12 NOTE — ED Provider Notes (Signed)
Medical screening examination/treatment/procedure(s) were performed by non-physician practitioner and as supervising physician I was immediately available for consultation/collaboration.  EKG Interpretation   None         Charis Juliana N Kamyrah Feeser, DO 05/12/13 0713 

## 2016-11-16 ENCOUNTER — Emergency Department (HOSPITAL_COMMUNITY)
Admission: EM | Admit: 2016-11-16 | Discharge: 2016-11-16 | Disposition: A | Discharge status: Home / Self Care | Payer: Self-pay | Attending: Emergency Medicine | Admitting: Emergency Medicine

## 2016-11-16 ENCOUNTER — Encounter (HOSPITAL_COMMUNITY): Payer: Self-pay | Admitting: *Deleted

## 2016-11-16 DIAGNOSIS — L03221 Cellulitis of neck: Secondary | ICD-10-CM

## 2016-11-16 DIAGNOSIS — F17210 Nicotine dependence, cigarettes, uncomplicated: Secondary | ICD-10-CM | POA: Insufficient documentation

## 2016-11-16 DIAGNOSIS — L0293 Carbuncle, unspecified: Secondary | ICD-10-CM

## 2016-11-16 DIAGNOSIS — L0213 Carbuncle of neck: Secondary | ICD-10-CM | POA: Insufficient documentation

## 2016-11-16 MED ORDER — HYDROCODONE-ACETAMINOPHEN 5-325 MG PO TABS: 1.0000 | ORAL_TABLET | Freq: Four times a day (QID) | ORAL | 0 refills | Status: DC | PRN

## 2016-11-16 MED ORDER — CHLORHEXIDINE GLUCONATE 4 % EX LIQD: Freq: Every day | CUTANEOUS | 0 refills | Status: DC | PRN

## 2016-11-16 MED ORDER — DOXYCYCLINE HYCLATE 100 MG PO CAPS: 100.0000 mg | ORAL_CAPSULE | Freq: Two times a day (BID) | ORAL | 0 refills | Status: DC

## 2016-11-16 MED ORDER — LIDOCAINE-EPINEPHRINE (PF) 2 %-1:200000 IJ SOLN
10.0000 mL | Freq: Once | INTRAMUSCULAR | Status: AC
  Administered 2016-11-16: 10 mL
  Filled 2016-11-16: qty 20

## 2016-11-16 NOTE — ED Notes (Signed)
Pt is in stable condition upon d/c and ambulates from ED. 

## 2016-11-16 NOTE — ED Triage Notes (Addendum)
Pt c/o R arm abcess on bil  under arm & R neck abcess, pt has redness & swelling present, hx of MRSA, pt A&O x4

## 2016-11-16 NOTE — ED Provider Notes (Signed)
[facility]-EMERGENCY DEPT Provider Note   CSN: [id number] Arrival date & time: 11/16/16  1314     History   Chief Complaint Chief Complaint  Patient presents with  . Abscess    HPI Gabriel Edwards is a 36 y.o. male.  Patient with hx of MRSA presents to the ED with a chief complaint of abscess.  He states that he was treated several years ago for the same and [place].  He was treated by infectious disease in the past.  He denies any history of HIV/AIDs, or DM.  He states that he has a small bump on the right side of his neck that radiates to there surrounding tissue.  He denies any fevers, chills, nausea, or vomiting.  He states that he has tried several rounds of antibiotics in the past, which always clear his immediate breakout, but he has always had recurrent infections.  He states that he has a small bump in his nose which he has been applying mupirocin to.  He also states that he recently "popped" a bump that came to a head in his right axilla.  He would like referral back to the ID clinic.   The history is provided by the patient. No language interpreter was used.    Past Medical History:  Diagnosis Date  . MRSA (methicillin resistant staph aureus) culture positive   . MRSA (methicillin resistant Staphylococcus aureus)     There are no active problems to display for this patient.   Past Surgical History:  Procedure Laterality Date  . LEG SURGERY     for MRSA       Home Medications    Prior to Admission medications   Medication Sig Start Date End Date Taking? Authorizing Provider  amoxicillin (AMOXIL) 500 MG capsule Take 1 capsule (500 mg total) by mouth 3 (three) times daily. 05/11/13   Ivery Quale, PA-C  meloxicam (MOBIC) 7.5 MG tablet 1 po bid with food 05/11/13   Ivery Quale, PA-C  Multiple Vitamin (MULTIVITAMIN WITH MINERALS) TABS tablet Take 1 tablet by mouth daily.    Historical Provider, MD  naproxen sodium (ALEVE) 220 MG tablet Take 220 mg  by mouth 2 (two) times daily as needed (Pain).    Historical Provider, MD  traMADol Janean Sark) 50 MG tablet 1 or 2 po q6h prn pain 05/11/13   Ivery Quale, PA-C    Family History No family history on file.  Social History Social History  Substance Use Topics  . Smoking status: Current Some Day Smoker    Packs/day: 1.00    Types: Cigarettes  . Smokeless tobacco: Never Used  . Alcohol use No     Allergies   Sulfa antibiotics   Review of Systems Review of Systems  Skin: Positive for color change.  All other systems reviewed and are negative.    Physical Exam Updated Vital Signs BP (!) 138/105 (BP Location: Left Arm)   Pulse 94   Temp 98.2 F (36.8 C) (Oral)   Resp 17   Ht  (1.854 m)   Wt 97.5 kg   SpO2 100%   BMI 28.37 kg/m   Physical Exam  Constitutional: He is oriented to person, place, and time. He appears well-developed and well-nourished.  HENT:  Head: Normocephalic and atraumatic.  2x2 cm abscess to the right lateral neck with mild surrounding cellulitis No drainable abscess seen in left nares  Eyes: Conjunctivae and EOM are normal. Pupils are equal, round, and reactive  to light. Right eye exhibits no discharge. Left eye exhibits no discharge. No scleral icterus.  Neck: Normal range of motion. Neck supple. No JVD present.  Cardiovascular: Normal rate, regular rhythm and normal heart sounds.  Exam reveals no gallop and no friction rub.   No murmur heard. Pulmonary/Chest: Effort normal and breath sounds normal. No respiratory distress. He has no wheezes. He has no rales. He exhibits no tenderness.  Abdominal: Soft. He exhibits no distension and no mass. There is no tenderness. There is no rebound and no guarding.  Musculoskeletal: Normal range of motion. He exhibits no edema or tenderness.  Neurological: He is alert and oriented to person, place, and time.  Skin: Skin is warm and dry.  Scattered small lesions on extremities, no other abscesses, or large  cellulitic areas.  Psychiatric: He has a normal mood and affect. His behavior is normal. Judgment and thought content normal.  Nursing note and vitals reviewed.    ED Treatments / Results  Labs (all labs ordered are listed, but only abnormal results are displayed) Labs Reviewed - No data to display  EKG  EKG Interpretation None       Radiology No results found.  Procedures Procedures (including critical care time)  Medications Ordered in ED Medications  lidocaine-EPINEPHrine (XYLOCAINE W/EPI) 2 %-1:200000 (PF) injection 10 mL (not administered)     Initial Impression / Assessment and Plan / ED Course  I have reviewed the triage vital signs and the nursing notes.  Pertinent labs & imaging results that were available during my care of the patient were reviewed by me and considered in my medical decision making (see chart for details).     Patient with hx of MRSA.  Here with abscess today.  Will I&D.  Treatment plan includes warm compresses, doxycycline, and chlorhexidine rinses.  Recommend close follow-up with ID.  Final Clinical Impressions(s) / ED Diagnoses   Final diagnoses:  Carbuncle  Cellulitis of neck    New Prescriptions New Prescriptions   CHLORHEXIDINE (HIBICLENS) 4 % EXTERNAL LIQUID    Apply topically daily as needed.   DOXYCYCLINE (VIBRAMYCIN) 100 MG CAPSULE    Take 1 capsule (100 mg total) by mouth 2 (two) times daily.   HYDROCODONE-ACETAMINOPHEN (NORCO/VICODIN) 5-325 MG TABLET    Take 1-2 tablets by mouth every 6 (six) hours as needed.     Roxy Horseman, PA-C 11/16/16 1619    Rolland Porter, MD 12/01/16 [phone]

## 2017-01-07 ENCOUNTER — Emergency Department (HOSPITAL_COMMUNITY)
Admission: EM | Admit: 2017-01-07 | Discharge: 2017-01-07 | Disposition: A | Discharge status: Home / Self Care | Payer: Self-pay | Attending: Emergency Medicine | Admitting: Emergency Medicine

## 2017-01-07 ENCOUNTER — Encounter (HOSPITAL_COMMUNITY): Payer: Self-pay | Admitting: Emergency Medicine

## 2017-01-07 DIAGNOSIS — K0889 Other specified disorders of teeth and supporting structures: Secondary | ICD-10-CM

## 2017-01-07 DIAGNOSIS — K029 Dental caries, unspecified: Secondary | ICD-10-CM | POA: Insufficient documentation

## 2017-01-07 DIAGNOSIS — Z79899 Other long term (current) drug therapy: Secondary | ICD-10-CM | POA: Insufficient documentation

## 2017-01-07 DIAGNOSIS — F17210 Nicotine dependence, cigarettes, uncomplicated: Secondary | ICD-10-CM | POA: Insufficient documentation

## 2017-01-07 MED ORDER — CLINDAMYCIN HCL 150 MG PO CAPS
300.0000 mg | ORAL_CAPSULE | Freq: Once | ORAL | Status: AC
  Administered 2017-01-07: 300 mg via ORAL
  Filled 2017-01-07: qty 2

## 2017-01-07 MED ORDER — TRAMADOL HCL 50 MG PO TABS
100.0000 mg | ORAL_TABLET | Freq: Once | ORAL | Status: AC
  Administered 2017-01-07: 100 mg via ORAL
  Filled 2017-01-07: qty 2

## 2017-01-07 MED ORDER — CLINDAMYCIN HCL 150 MG PO CAPS: ORAL_CAPSULE | ORAL | 0 refills | Status: DC

## 2017-01-07 MED ORDER — PROMETHAZINE HCL 12.5 MG PO TABS
12.5000 mg | ORAL_TABLET | Freq: Once | ORAL | Status: DC
  Filled 2017-01-07: qty 1

## 2017-01-07 MED ORDER — IBUPROFEN 800 MG PO TABS
800.0000 mg | ORAL_TABLET | Freq: Once | ORAL | Status: AC
  Administered 2017-01-07: 800 mg via ORAL
  Filled 2017-01-07: qty 1

## 2017-01-07 MED ORDER — TRAMADOL HCL 50 MG PO TABS: 50.0000 mg | ORAL_TABLET | Freq: Four times a day (QID) | ORAL | 0 refills | Status: DC | PRN

## 2017-01-07 MED ORDER — IBUPROFEN 600 MG PO TABS: 600.0000 mg | ORAL_TABLET | Freq: Four times a day (QID) | ORAL | 0 refills | Status: DC

## 2017-01-07 NOTE — ED Provider Notes (Signed)
[facility]-EM[facility]Y DEPT Provider Note   CSN: [id number] Arrival date & time: 01/07/17  1049     History   Chief Complaint Chief Complaint  Patient presents with  . Dental Pain    HPI Gabriel Edwards is a 36 y.o. male.  Patient is a 36 year old male who presents to the emergency department with a complaint of right-sided dental pain.  The patient gives a four-day history of dental pain. He denies any recent injury or trauma to the right side of the mouth. He has had problems with dental caries in the past. He complains that he has had some intermittent mild problems with chills. He notices that the pain is increasing in spite of conservative measures at home. Patient states that he has been diagnosed with methicillin-resistant staph and he is concerned about staph infection related to his dental issue. He denies any difficulty with breathing or swallowing.  No PCP.      Past Medical History:  Diagnosis Date  . MRSA (methicillin resistant staph aureus) culture positive   . MRSA (methicillin resistant Staphylococcus aureus)     There are no active problems to display for this patient.   Past Surgical History:  Procedure Laterality Date  . LEG SURGERY     for MRSA       Home Medications    Prior to Admission medications   Medication Sig Start Date End Date Taking? Authorizing Provider  acetaminophen (TYLENOL) 325 MG tablet Take 650 mg by mouth every 6 (six) hours as needed for mild pain.    [provider]  amoxicillin (AMOXIL) 500 MG capsule Take 1 capsule (500 mg total) by mouth 3 (three) times daily. Patient not taking: Reported on 11/16/2016 05/11/13   Ivery QualeBryant, Thang Flett, PA-C  chlorhexidine (HIBICLENS) 4 % external liquid Apply topically daily as needed. 11/16/16   Roxy HorsemanBrowning, Robert, PA-C  doxycycline (VIBRAMYCIN) 100 MG capsule Take 1 capsule (100 mg total) by mouth 2 (two) times daily. 11/16/16   Roxy HorsemanBrowning, Robert, PA-C  HYDROcodone-acetaminophen  (NORCO/VICODIN) 5-325 MG tablet Take 1-2 tablets by mouth every 6 (six) hours as needed. 11/16/16   Roxy HorsemanBrowning, Robert, PA-C  meloxicam (MOBIC) 7.5 MG tablet 1 po bid with food Patient not taking: Reported on 11/16/2016 05/11/13   Ivery QualeBryant, Tynasia Mccaul, PA-C  Multiple Vitamin (MULTIVITAMIN WITH MINERALS) TABS tablet Take 1 tablet by mouth daily.    [provider]  naproxen sodium (ALEVE) 220 MG tablet Take 220 mg by mouth 2 (two) times daily as needed (Pain).    [provider]  traMADol Janean Sark(ULTRAM) 50 MG tablet 1 or 2 po q6h prn pain Patient not taking: Reported on 11/16/2016 05/11/13   Ivery QualeBryant, Alyana Kreiter, PA-C    Family History History reviewed. No pertinent family history.  Social History Social History  Substance Use Topics  . Smoking status: Current Some Day Smoker    Packs/day: 1.00    Types: Cigarettes  . Smokeless tobacco: Never Used  . Alcohol use No     Allergies   Sulfa antibiotics   Review of Systems Review of Systems  Constitutional: Positive for chills. Negative for activity change.       All ROS Neg except as noted in HPI  HENT: Positive for dental problem. Negative for nosebleeds.   Eyes: Negative for photophobia and discharge.  Respiratory: Negative for cough, shortness of breath and wheezing.   Cardiovascular: Negative for chest pain and palpitations.  Gastrointestinal: Negative for abdominal pain and blood in stool.  Genitourinary: Negative for dysuria,  frequency and hematuria.  Musculoskeletal: Negative for arthralgias, back pain and neck pain.  Skin: Negative.   Neurological: Negative for dizziness, seizures and speech difficulty.  Psychiatric/Behavioral: Negative for confusion and hallucinations.     Physical Exam Updated Vital Signs BP 130/82 (BP Location: Right Arm)   Pulse 65   Temp 98.2 F (36.8 C) (Oral)   Resp 18   Ht 6\' 1"  (1.854 m)   Wt 93 kg (205 lb)   SpO2 99%   BMI 27.05 kg/m   Physical Exam  Constitutional: Vital signs are  normal. He appears well-developed and well-nourished. He is active.  HENT:  Head: Normocephalic and atraumatic.  Right Ear: Tympanic membrane, external ear and ear canal normal.  Left Ear: Tympanic membrane, external ear and ear canal normal.  Nose: Nose normal.  Mouth/Throat: Uvula is midline, oropharynx is clear and moist and mucous membranes are normal.  Multiple cavities noted. There is moderate swelling of the lower gum in the molar area. No visible abscess appreciated. There is very minimal swelling under the tongue. The airway is patent. The speech is clear.  Eyes: Conjunctivae, EOM and lids are normal. Pupils are equal, round, and reactive to light.  Neck: Trachea normal, normal range of motion and phonation normal. Neck supple. Carotid bruit is not present.  Cardiovascular: Normal rate, regular rhythm and normal pulses.   Abdominal: Soft. Normal appearance and bowel sounds are normal.  Lymphadenopathy:       Head (right side): No submental, no preauricular and no posterior auricular adenopathy present.       Head (left side): No submental, no preauricular and no posterior auricular adenopathy present.    He has no cervical adenopathy.  Neurological: He is alert. He has normal strength. No cranial nerve deficit or sensory deficit. GCS eye subscore is 4. GCS verbal subscore is 5. GCS motor subscore is 6.  Skin: Skin is warm and dry.  Psychiatric: His speech is normal.  Nursing note and vitals reviewed.    ED Treatments / Results  Labs (all labs ordered are listed, but only abnormal results are displayed) Labs Reviewed - No data to display  EKG  EKG Interpretation None       Radiology No results found.  Procedures Procedures (including critical care time)  Medications Ordered in ED Medications - No data to display   Initial Impression / Assessment and Plan / ED Course  I have reviewed the triage vital signs and the nursing notes.  Pertinent labs & imaging results  that were available during my care of the patient were reviewed by me and considered in my medical decision making (see chart for details).       Final Clinical Impressions(s) / ED Diagnoses mdm Vital signs within normal limits. Patient has long-term history of dental issues on according to chart review. Patient now has pain involving the right jaw and face. The patient's family reported that he complains of dizziness at times.  There is no evidence for lUDWIG'S ANGINA or other emergent situations . patient will be treated with clindamycin, ibuprofen, and 12 tablets of Ultram. Patient strongly encouraged to see a dentist as sone as possible. Dental resources given to the patient.    Final diagnoses:  Pain, dental  Dental caries    New Prescriptions New Prescriptions   CLINDAMYCIN (CLEOCIN) 150 MG CAPSULE    2 PO BID WITH FOOD   IBUPROFEN (ADVIL,MOTRIN) 600 MG TABLET    Take 1 tablet (600 mg total) by  mouth 4 (four) times daily.   TRAMADOL (ULTRAM) 50 MG TABLET    Take 1 tablet (50 mg total) by mouth every 6 (six) hours as needed.     Ivery Quale, PA-C 01/07/17 1226    Benjiman Core, MD 01/07/17 [phone]

## 2017-01-07 NOTE — ED Notes (Signed)
Pt made aware to return if symptoms worsen or if any life threatening symptoms occur.   

## 2017-01-07 NOTE — Discharge Instructions (Signed)
It is important that you see a dentist as soon as possible. Please use clindamycin 2 times daily with food. Please use 600 mg of ibuprofen with breakfast, lunch, dinner, and at bedtime. May use Ultram for more severe pain. Ultram may cause drowsiness, please do not drive, drink alcohol, operate machinery, when taking this medication.

## 2017-01-07 NOTE — ED Triage Notes (Signed)
Pt reports right sided dental pain x4 days. Pt denies any known injury. Pt reports intermittent chills and increased pain since last night. Airway patent. nad noted.

## 2017-02-25 ENCOUNTER — Encounter (HOSPITAL_COMMUNITY): Payer: Self-pay | Admitting: *Deleted

## 2017-02-25 ENCOUNTER — Emergency Department (HOSPITAL_COMMUNITY)
Admission: EM | Admit: 2017-02-25 | Discharge: 2017-02-26 | Disposition: A | Discharge status: Home / Self Care | Payer: Self-pay | Attending: Emergency Medicine | Admitting: Emergency Medicine

## 2017-02-25 DIAGNOSIS — L089 Local infection of the skin and subcutaneous tissue, unspecified: Secondary | ICD-10-CM | POA: Insufficient documentation

## 2017-02-25 DIAGNOSIS — R6883 Chills (without fever): Secondary | ICD-10-CM | POA: Insufficient documentation

## 2017-02-25 DIAGNOSIS — F17210 Nicotine dependence, cigarettes, uncomplicated: Secondary | ICD-10-CM | POA: Insufficient documentation

## 2017-02-25 DIAGNOSIS — R21 Rash and other nonspecific skin eruption: Secondary | ICD-10-CM | POA: Insufficient documentation

## 2017-02-25 MED ORDER — MUPIROCIN CALCIUM 2 % NA OINT: TOPICAL_OINTMENT | NASAL | 0 refills | Status: DC

## 2017-02-25 MED ORDER — CHLORHEXIDINE GLUCONATE 4 % EX LIQD: Freq: Every day | CUTANEOUS | 0 refills | Status: DC | PRN

## 2017-02-25 NOTE — ED Provider Notes (Signed)
[facility] Provider Note   CSN: [id number] Arrival date & time: 02/25/17  2141     History   Chief Complaint Chief Complaint  Patient presents with  . Abscess    HPI Joseph ArtJoshua L Kocak is a 36 y.o. male.  HPI  Is a 36 year old male with a history of MRSA who presents with concerns for recurrent infection. Patient reports one month history of swelling over his nasal septum. [facility]. At that time he was prescribed Bactrim. He finished a full course. He reports "a little" improvement on antibiotics but feels like it has gotten worse. He also reports a spot on the back of his neck. He he reports chills but no documented fevers over the last 2-3 days. Denies any other areas of skin infection. Denies illicit drug use including cocaine.  Past Medical History:  Diagnosis Date  . MRSA (methicillin resistant staph aureus) culture positive   . MRSA (methicillin resistant Staphylococcus aureus)     There are no active problems to display for this patient.   Past Surgical History:  Procedure Laterality Date  . LEG SURGERY     for MRSA       Home Medications    Prior to Admission medications   Medication Sig Start Date End Date Taking? Authorizing Provider  chlorhexidine (HIBICLENS) 4 % external liquid Apply topically daily as needed. 02/25/17   Haidyn Chadderdon, Mayer Maskerourtney F, MD  ibuprofen (ADVIL,MOTRIN) 600 MG tablet Take 1 tablet (600 mg total) by mouth 4 (four) times daily. Patient not taking: Reported on 02/25/2017 01/07/17   Ivery QualeBryant, Hobson, PA-C  mupirocin nasal ointment Idelle Jo(BACTROBAN) 2 % Apply in each nostril daily 02/25/17   Khalis Hittle, Mayer Maskerourtney F, MD  traMADol (ULTRAM) 50 MG tablet Take 1 tablet (50 mg total) by mouth every 6 (six) hours as needed. Patient not taking: Reported on 02/25/2017 01/07/17   Ivery QualeBryant, Hobson, PA-C    Family History No family history on file.  Social History Social History  Substance Use Topics  . Smoking status: Current  Some Day Smoker    Packs/day: 1.00    Types: Cigarettes  . Smokeless tobacco: Never Used  . Alcohol use No     Allergies   Sulfa antibiotics   Review of Systems Review of Systems  Constitutional: Positive for chills. Negative for fever.  Respiratory: Negative for cough and shortness of breath.   Cardiovascular: Negative for chest pain.  Skin: Positive for rash.  All other systems reviewed and are negative.    Physical Exam Updated Vital Signs BP (!) 126/96   Pulse 88   Temp 98.6 F (37 C)   Resp 20   Ht 6\' 1"  (1.854 m)   Wt 97.5 kg (215 lb)   SpO2 98%   BMI 28.37 kg/m   Physical Exam  Constitutional: He is oriented to person, place, and time. He appears well-developed and well-nourished.  HENT:  Head: Normocephalic and atraumatic.  Mouth/Throat: Oropharynx is clear and moist.  Abrasion noted over the right side of his nose, mildly edematous nasal turbulence, tenderness to palpation over the nasal septum without obvious abscess  Cardiovascular: Normal rate, regular rhythm and normal heart sounds.   No murmur heard. Pulmonary/Chest: Effort normal and breath sounds normal. No respiratory distress. He has no wheezes.  Musculoskeletal: He exhibits no edema.  Neurological: He is alert and oriented to person, place, and time.  Skin: Skin is warm and dry.  Healing scabbed lesion over the posterior  neck, no fluctuance or erythema associated  Psychiatric: He has a normal mood and affect.  Nursing note and vitals reviewed.    ED Treatments / Results  Labs (all labs ordered are listed, but only abnormal results are displayed) Labs Reviewed - No data to display  EKG  EKG Interpretation None       Radiology No results found.  Procedures Procedures (including critical care time)  Medications Ordered in ED Medications - No data to display   Initial Impression / Assessment and Plan / ED Course  I have reviewed the triage vital signs and the nursing  notes.  Pertinent labs & imaging results that were available during my care of the patient were reviewed by me and considered in my medical decision making (see chart for details).    Patient presents with concerns for recurrent MRSA. Reports one month history of a nasal lesion. Just finished a course of Bactrim. I do not appreciate any obvious abscess. He does have some tenderness palpation over the nasal septum. No perforation or overlying skin changes. He is nontoxic and afebrile. Recommend Bactroban and Hibiclens wash. No indication for antibody to this time.  After history, exam, and medical workup I feel the patient has been appropriately medically screened and is safe for discharge home. Pertinent diagnoses were discussed with the patient. Patient was given return precautions.   Final Clinical Impressions(s) / ED Diagnoses   Final diagnoses:  Skin infection    New Prescriptions New Prescriptions   CHLORHEXIDINE (HIBICLENS) 4 % EXTERNAL LIQUID    Apply topically daily as needed.   MUPIROCIN NASAL OINTMENT (BACTROBAN) 2 %    Apply in each nostril daily     Shon Baton, MD 02/25/17 2352

## 2017-02-25 NOTE — ED Triage Notes (Signed)
ABSCESS IN LEFT NARES AREA FOR A MONTH, ALSO STATES HE HAS A KNOT IN THE BACK OF HIS HEAD, HAS A HISTORY OF MRSA

## 2017-02-26 NOTE — ED Notes (Signed)
Pt alert & oriented x4, stable gait. Patient given discharge instructions, paperwork & prescription(s). Registration completed in the room.  Patient verbalized understanding. Pt left department w/ no further questions. 

## 2017-09-19 ENCOUNTER — Emergency Department (HOSPITAL_COMMUNITY)
Admission: EM | Admit: 2017-09-19 | Discharge: 2017-09-19 | Disposition: A | Discharge status: Home / Self Care | Payer: BLUE CROSS/BLUE SHIELD | Attending: Emergency Medicine | Admitting: Emergency Medicine

## 2017-09-19 ENCOUNTER — Encounter (HOSPITAL_COMMUNITY): Payer: Self-pay | Admitting: Emergency Medicine

## 2017-09-19 ENCOUNTER — Emergency Department (HOSPITAL_COMMUNITY): Payer: BLUE CROSS/BLUE SHIELD

## 2017-09-19 DIAGNOSIS — R5383 Other fatigue: Secondary | ICD-10-CM | POA: Insufficient documentation

## 2017-09-19 DIAGNOSIS — L03115 Cellulitis of right lower limb: Secondary | ICD-10-CM

## 2017-09-19 DIAGNOSIS — F17210 Nicotine dependence, cigarettes, uncomplicated: Secondary | ICD-10-CM | POA: Insufficient documentation

## 2017-09-19 DIAGNOSIS — Z8614 Personal history of Methicillin resistant Staphylococcus aureus infection: Secondary | ICD-10-CM | POA: Diagnosis not present

## 2017-09-19 DIAGNOSIS — L03116 Cellulitis of left lower limb: Secondary | ICD-10-CM | POA: Insufficient documentation

## 2017-09-19 DIAGNOSIS — K029 Dental caries, unspecified: Secondary | ICD-10-CM

## 2017-09-19 DIAGNOSIS — K0889 Other specified disorders of teeth and supporting structures: Secondary | ICD-10-CM | POA: Diagnosis present

## 2017-09-19 LAB — COMPREHENSIVE METABOLIC PANEL
ALT: 70 U/L — AB (ref 17–63)
AST: 37 U/L (ref 15–41)
Albumin: 4 g/dL (ref 3.5–5.0)
Alkaline Phosphatase: 94 U/L (ref 38–126)
Anion gap: 9 (ref 5–15)
BILIRUBIN TOTAL: 0.4 mg/dL (ref 0.3–1.2)
BUN: 10 mg/dL (ref 6–20)
CO2: 26 mmol/L (ref 22–32)
CREATININE: 0.96 mg/dL (ref 0.61–1.24)
Calcium: 9 mg/dL (ref 8.9–10.3)
Chloride: 102 mmol/L (ref 101–111)
Glucose, Bld: 95 mg/dL (ref 65–99)
Potassium: 4 mmol/L (ref 3.5–5.1)
Sodium: 137 mmol/L (ref 135–145)
TOTAL PROTEIN: 7 g/dL (ref 6.5–8.1)

## 2017-09-19 LAB — CBC WITH DIFFERENTIAL/PLATELET
BASOS ABS: 0 10*3/uL (ref 0.0–0.1)
BASOS PCT: 0 %
EOS PCT: 3 %
Eosinophils Absolute: 0.2 10*3/uL (ref 0.0–0.7)
HCT: 42.5 % (ref 39.0–52.0)
Hemoglobin: 14.2 g/dL (ref 13.0–17.0)
Lymphocytes Relative: 32 %
Lymphs Abs: 2.4 10*3/uL (ref 0.7–4.0)
MCH: 28.8 pg (ref 26.0–34.0)
MCHC: 33.4 g/dL (ref 30.0–36.0)
MCV: 86.2 fL (ref 78.0–100.0)
Monocytes Absolute: 0.7 10*3/uL (ref 0.1–1.0)
Monocytes Relative: 10 %
NEUTROS PCT: 55 %
Neutro Abs: 4.2 10*3/uL (ref 1.7–7.7)
PLATELETS: 249 10*3/uL (ref 150–400)
RBC: 4.93 MIL/uL (ref 4.22–5.81)
RDW: 12.8 % (ref 11.5–15.5)
WBC: 7.5 10*3/uL (ref 4.0–10.5)

## 2017-09-19 LAB — BRAIN NATRIURETIC PEPTIDE: B Natriuretic Peptide: 10 pg/mL (ref 0.0–100.0)

## 2017-09-19 MED ORDER — IBUPROFEN 600 MG PO TABS: 600.0000 mg | ORAL_TABLET | Freq: Four times a day (QID) | ORAL | 0 refills | Status: DC

## 2017-09-19 MED ORDER — CLINDAMYCIN HCL 150 MG PO CAPS: ORAL_CAPSULE | ORAL | 0 refills | Status: DC

## 2017-09-19 MED ORDER — ONDANSETRON HCL 4 MG PO TABS
4.0000 mg | ORAL_TABLET | Freq: Once | ORAL | Status: AC
  Administered 2017-09-19: 4 mg via ORAL
  Filled 2017-09-19: qty 1

## 2017-09-19 MED ORDER — IBUPROFEN 800 MG PO TABS
800.0000 mg | ORAL_TABLET | Freq: Once | ORAL | Status: AC
  Administered 2017-09-19: 800 mg via ORAL
  Filled 2017-09-19: qty 1

## 2017-09-19 MED ORDER — CLINDAMYCIN HCL 150 MG PO CAPS
300.0000 mg | ORAL_CAPSULE | Freq: Once | ORAL | Status: AC
  Administered 2017-09-19: 300 mg via ORAL
  Filled 2017-09-19: qty 2

## 2017-09-19 NOTE — ED Provider Notes (Signed)
[facility] Provider Note   CSN: [id number] Arrival date & time: 09/19/17  1546     History   Chief Complaint Chief Complaint  Patient presents with  . Dental Pain    HPI Gabriel Edwards is a 37 y.o. male.  Patient is a 37 year old male who presents to the emergency department with a complaint of dental pain.  Patient states that he has had problems with his teeth for quite some time.  Most recently he is having pain in the right lower jaw area.  The pain is been getting progressively worse during the week.  Patient noted some mild swelling about the gum and also about his right lower jaw area.  He presents to the emergency department for assistance with this problem.  The patient does not recall chills or fever.  He is not noticed any bleeding around the tooth.  The patient has concerned because he has been diagnosed with methicillin-resistant staph in the past and he is concerned about possible infection.  The patient also request to be evaluated for lower extremity swelling and redness.  He states this is been going on for a week as well.  He states that he is noted more redness today than been previously present.  He does not have pain in this area, but just notices the swelling and tightness and redness.  He does not recall any injury to either leg.  There is been no operations or procedures on the lower extremities.  He is not had drainage or discharge from the legs.  The patient states that he notices minimal change in his ability to get around.  He has some mild shortness of breath occasionally, but states that he really attributed this to him smoking.  He is not had any recent changes in diet or medication.      Past Medical History:  Diagnosis Date  . MRSA (methicillin resistant staph aureus) culture positive   . MRSA (methicillin resistant Staphylococcus aureus)     There are no active problems to display for this patient.   Past Surgical  History:  Procedure Laterality Date  . FRACTURE SURGERY    . LEG SURGERY     for MRSA       Home Medications    Prior to Admission medications   Medication Sig Start Date End Date Taking? Authorizing Provider  chlorhexidine (HIBICLENS) 4 % external liquid Apply topically daily as needed. 02/25/17   Horton, Mayer Maskerourtney F, MD  ibuprofen (ADVIL,MOTRIN) 600 MG tablet Take 1 tablet (600 mg total) by mouth 4 (four) times daily. Patient not taking: Reported on 02/25/2017 01/07/17   Ivery QualeBryant, Ariana Cavenaugh, PA-C  mupirocin nasal ointment Idelle Jo(BACTROBAN) 2 % Apply in each nostril daily 02/25/17   Horton, Mayer Maskerourtney F, MD  traMADol (ULTRAM) 50 MG tablet Take 1 tablet (50 mg total) by mouth every 6 (six) hours as needed. Patient not taking: Reported on 02/25/2017 01/07/17   Ivery QualeBryant, Arda Daggs, PA-C    Family History History reviewed. No pertinent family history.  Social History Social History   Tobacco Use  . Smoking status: Current Some Day Smoker    Packs/day: 1.00    Types: Cigarettes  . Smokeless tobacco: Never Used  Substance Use Topics  . Alcohol use: No  . Drug use: No     Allergies   Sulfa antibiotics   Review of Systems Review of Systems  Constitutional: Positive for fatigue. Negative for activity change, appetite change, chills and fever.  All ROS Neg except as noted in HPI  HENT: Positive for dental problem. Negative for nosebleeds.   Eyes: Negative for photophobia and discharge.  Respiratory: Negative for cough, chest tightness, shortness of breath and wheezing.   Cardiovascular: Positive for leg swelling. Negative for chest pain and palpitations.  Gastrointestinal: Negative for abdominal pain, blood in stool, nausea and vomiting.  Genitourinary: Negative for dysuria, frequency and hematuria.  Musculoskeletal: Negative for arthralgias, back pain and neck pain.  Skin: Negative.   Neurological: Negative for dizziness, seizures and speech difficulty.  Psychiatric/Behavioral: Negative for  confusion and hallucinations.     Physical Exam Updated Vital Signs BP 126/89 (BP Location: Right Arm)   Pulse 72   Temp 97.9 F (36.6 C) (Oral)   Resp 16   Ht 6\' 1"  (1.854 m)   Wt 93 kg (205 lb)   SpO2 99%   BMI 27.05 kg/m   Physical Exam  Constitutional: He is oriented to person, place, and time. He appears well-developed and well-nourished.  Non-toxic appearance.  HENT:  Head: Normocephalic.  Right Ear: Tympanic membrane and external ear normal.  Left Ear: Tympanic membrane and external ear normal.  There are multiple dental caries noted of the upper and lower jaw area.  There are some swollen areas of the right lower jaw.  There is some mild lower facial swelling.  There is no visible abscess noted at this time.  There is no swelling under the tongue.  The airway is patent.  Eyes: EOM and lids are normal. Pupils are equal, round, and reactive to light.  Neck: Normal range of motion. Neck supple. Carotid bruit is not present.  Cardiovascular: Normal rate, regular rhythm, normal heart sounds, intact distal pulses and normal pulses.  Pulmonary/Chest: No respiratory distress. He has wheezes. He has rhonchi.  There is symmetrical rise and fall of the chest.  Patient speaks in complete sentences without problem.  There are some soft wheezes and rhonchi present anteriorly and posteriorly.  Abdominal: Soft. Bowel sounds are normal. There is no tenderness. There is no guarding.  Musculoskeletal: Normal range of motion.       Right lower leg: He exhibits edema.       Left lower leg: He exhibits edema.  There is 1-2+ pitting edema from the anterior tibial tuberosity down to the dorsum of the right and left foot.  There is some increased redness with mild increased warmth present.  Negative Homans sign.  There are no lesions noted between the toes.  No evidence of puncture wounds to the plantar surface of the right or the left lower extremity.  The dorsalis pedis pulses 2+ bilaterally.   Capillary refill is less than 2 seconds.  Lymphadenopathy:       Head (right side): No submandibular adenopathy present.       Head (left side): No submandibular adenopathy present.    He has no cervical adenopathy.  Neurological: He is alert and oriented to person, place, and time. He has normal strength. No cranial nerve deficit or sensory deficit.  Skin: Skin is warm and dry.  Psychiatric: He has a normal mood and affect. His speech is normal.  Nursing note and vitals reviewed.    ED Treatments / Results  Labs (all labs ordered are listed, but only abnormal results are displayed) Labs Reviewed  CBC WITH DIFFERENTIAL/PLATELET  COMPREHENSIVE METABOLIC PANEL  BRAIN NATRIURETIC PEPTIDE    EKG  EKG Interpretation None       Radiology Dg Chest  2 View  Result Date: 09/19/2017 CLINICAL DATA:  Wheezing with lower extremity swelling EXAM: CHEST  2 VIEW COMPARISON:  12/15/2014 FINDINGS: The heart size and mediastinal contours are within normal limits. Both lungs are clear. The visualized skeletal structures are unremarkable. IMPRESSION: No active cardiopulmonary disease. Electronically Signed   By: Jasmine Pang M.D.   On: 09/19/2017 16:49    Procedures Procedures (including critical care time)  Medications Ordered in ED Medications - No data to display   Initial Impression / Assessment and Plan / ED Course  I have reviewed the triage vital signs and the nursing notes.  Pertinent labs & imaging results that were available during my care of the patient were reviewed by me and considered in my medical decision making (see chart for details).       Final Clinical Impressions(s) / ED Diagnoses Vital signs within normal limits.  Pulse oximetry is 96% on room air.  Within normal limits by my interpretation.  The patient has multiple dental caries of the upper and lower jaw area.  There is no swelling under the tongue.  No evidence of Ludwig's angina or other emergent  changes.  Differential dx of the swelling of the lower extremities: venous insufficiency, cellulitis, DVT, CHF, liver or renal issues.  No ulcers noted in between the toes, or of the plantar surface. Two lesions on the posterior calf of the right leg. Healing lesion of the medial mid lower leg on the left. Question mild to mod cellulitis.  Chest x-ray is negative for congestive heart failure or other problems.  The natruretic peptide is also negative.  Doubt congestive heart failure as a source of the swelling.  Only 1 of the liver enzymes is slightly elevated, doubt liver issues.  The renal studies are well within normal limits, doubt renal issues.  Patient will be placed on clindamycin and ibuprofen.  Patient is asked the patient to keep his legs elevated above his waist when sitting and above his heart when lying down.  Patient will return to the emergency department on Monday, March 3 for recheck of the swelling of the legs.  I have instructed the patient on the importance of seeing the dentist as soon as possible given that he has been battling with this oral infection for several months.   Final diagnoses:  Dental caries  Cellulitis of both lower extremities    ED Discharge Orders    None       Ivery Quale, PA-C 09/19/17 1810    Margarita Grizzle, MD 09/20/17 [phone]

## 2017-09-19 NOTE — ED Triage Notes (Addendum)
Pt reports lower right dental pain for a week progressing with swelling of face.  Pt also has swelling of ankles with redness bilaterally.

## 2017-09-19 NOTE — Discharge Instructions (Signed)
You have multiple infected teeth present.  There is swelling of the gum on the right lower jaw and some mild facial swelling.  It is important that she see a dentist as soon as possible to have these removed.  Please use the clindamycin 2 tablets 2 times daily.  Use ibuprofen with breakfast, lunch, dinner, and at bedtime.  You have swelling of your lower extremity from your foot to the area just below your knee.  Please keep your legs elevated on several pillows above your waist when you are sitting, and above your heart when you are lying down.  There is a possibility this could be related to infection, as you have some healing sores on the back of your legs.  The clindamycin should help with this if this is infection.  Please return to the emergency department on Monday March for for recheck of your legs.  Please return sooner if any high fever, nausea vomiting, increased swelling and redness of your legs.  Please call the [facility].  They will help you with obtaining a primary care physician.

## 2017-09-19 NOTE — ED Notes (Signed)
Pt states he DOES NOT want any opiates

## 2018-04-30 ENCOUNTER — Encounter (HOSPITAL_COMMUNITY): Payer: Self-pay

## 2018-04-30 ENCOUNTER — Other Ambulatory Visit: Payer: Self-pay

## 2018-04-30 ENCOUNTER — Emergency Department (HOSPITAL_COMMUNITY)
Admission: EM | Admit: 2018-04-30 | Discharge: 2018-04-30 | Disposition: A | Discharge status: Home / Self Care | Payer: BLUE CROSS/BLUE SHIELD | Attending: Emergency Medicine | Admitting: Emergency Medicine

## 2018-04-30 DIAGNOSIS — K0889 Other specified disorders of teeth and supporting structures: Secondary | ICD-10-CM | POA: Insufficient documentation

## 2018-04-30 MED ORDER — CLINDAMYCIN HCL 150 MG PO CAPS
300.0000 mg | ORAL_CAPSULE | Freq: Once | ORAL | Status: AC
  Administered 2018-04-30: 300 mg via ORAL
  Filled 2018-04-30: qty 2

## 2018-04-30 MED ORDER — IBUPROFEN 800 MG PO TABS
800.0000 mg | ORAL_TABLET | Freq: Once | ORAL | Status: AC
  Administered 2018-04-30: 800 mg via ORAL
  Filled 2018-04-30: qty 1

## 2018-04-30 MED ORDER — IBUPROFEN 800 MG PO TABS: 800.0000 mg | ORAL_TABLET | Freq: Three times a day (TID) | ORAL | 0 refills | Status: DC

## 2018-04-30 MED ORDER — CLINDAMYCIN HCL 150 MG PO CAPS: 300.0000 mg | ORAL_CAPSULE | Freq: Four times a day (QID) | ORAL | 0 refills | Status: DC

## 2018-04-30 NOTE — ED Provider Notes (Signed)
[facility] EMERGENCY DEPARTMENT Provider Note   CSN: [id number] Arrival date & time: 04/30/18  1516     History   Chief Complaint Chief Complaint  Patient presents with  . Dental Pain    HPI Gabriel Edwards is a 37 y.o. male.  HPI   Gabriel Edwards is a 36 y.o. male who presents to the Emergency Department complaining of left upper and lower dental pain.  Symptoms have been present for several days.  He describes a sharp worsening pain that radiates toward his ear and aggravated by chewing.  He states that he is trying to make arrangements to see a dentist but has recently had a death in his family and has been unable to make an appointment.  He denies fever, chills, facial swelling, difficulty swallowing or breathing.   Past Medical History:  Diagnosis Date  . MRSA (methicillin resistant staph aureus) culture positive   . MRSA (methicillin resistant Staphylococcus aureus)     There are no active problems to display for this patient.   Past Surgical History:  Procedure Laterality Date  . FRACTURE SURGERY    . LEG SURGERY     for MRSA      Home Medications    Prior to Admission medications   Medication Sig Start Date End Date Taking? Authorizing Provider  chlorhexidine (HIBICLENS) 4 % external liquid Apply topically daily as needed. 02/25/17   Shon Baton, MD  clindamycin (CLEOCIN) 150 MG capsule 2 po bid with food 09/19/17   Ivery Quale, PA-C  ibuprofen (ADVIL,MOTRIN) 600 MG tablet Take 1 tablet (600 mg total) by mouth 4 (four) times daily. 09/19/17   Ivery Quale, PA-C  mupirocin nasal ointment (BACTROBAN) 2 % Apply in each nostril daily 02/25/17   Horton, Mayer Masker, MD  traMADol (ULTRAM) 50 MG tablet Take 1 tablet (50 mg total) by mouth every 6 (six) hours as needed. Patient not taking: Reported on 02/25/2017 01/07/17   Ivery Quale, PA-C    Family History No family history on file.  Social History Social History   Tobacco Use  . Smoking  status: Current Some Day Smoker    Packs/day: 1.00    Types: Cigarettes  . Smokeless tobacco: Never Used  Substance Use Topics  . Alcohol use: No  . Drug use: No     Allergies   Sulfa antibiotics   Review of Systems Review of Systems  Constitutional: Negative for appetite change and fever.  HENT: Positive for dental problem. Negative for congestion, facial swelling, sore throat and trouble swallowing.   Musculoskeletal: Negative for neck pain and neck stiffness.  Neurological: Negative for dizziness, facial asymmetry and headaches.  Hematological: Negative for adenopathy.     Physical Exam Updated Vital Signs BP 125/76 (BP Location: Left Arm)   Pulse 73   Temp 98.2 F (36.8 C) (Oral)   Resp 16   Wt 95.3 kg   SpO2 99%   BMI 27.71 kg/m   Physical Exam  Constitutional: He is oriented to person, place, and time. He appears well-developed and well-nourished. No distress.  HENT:  Head: Normocephalic and atraumatic.  Right Ear: Tympanic membrane and ear canal normal.  Left Ear: Tympanic membrane and ear canal normal.  Mouth/Throat: Uvula is midline, oropharynx is clear and moist and mucous membranes are normal. No trismus in the jaw. Dental caries present. No dental abscesses or uvula swelling.  Widespread dental decay and tenderness to palpation of the left lower premolars and molars.  Erythema  and edema of the gingiva without obvious abscess. no facial swelling, trismus, or sublingual abnml.    Neck: Normal range of motion. Neck supple.  Cardiovascular: Normal rate, regular rhythm and normal heart sounds.  No murmur heard. Pulmonary/Chest: Effort normal and breath sounds normal.  Musculoskeletal: Normal range of motion.  Lymphadenopathy:    He has no cervical adenopathy.  Neurological: He is alert and oriented to person, place, and time. He exhibits normal muscle tone. Coordination normal.  Skin: Skin is warm and dry.  Nursing note and vitals reviewed.    ED  Treatments / Results  Labs (all labs ordered are listed, but only abnormal results are displayed) Labs Reviewed - No data to display  EKG None  Radiology No results found.  Procedures Procedures (including critical care time)  Medications Ordered in ED Medications  clindamycin (CLEOCIN) capsule 300 mg (has no administration in time range)  ibuprofen (ADVIL,MOTRIN) tablet 800 mg (has no administration in time range)     Initial Impression / Assessment and Plan / ED Course  I have reviewed the triage vital signs and the nursing notes.  Pertinent labs & imaging results that were available during my care of the patient were reviewed by me and considered in my medical decision making (see chart for details).     Patient well-appearing.  Nontoxic.  Vitals reviewed.  Airways patent.  No concerning symptoms for Ludewig's angina.  He does have a dentist to follow-up with.  Patient appropriate for discharge home with antibiotics and anti-inflammatory for pain.  Final Clinical Impressions(s) / ED Diagnoses   Final diagnoses:  Pain, dental    ED Discharge Orders    None       Pauline Aus, PA-C 04/30/18 1700    Loren Racer, MD 05/06/18 [phone]

## 2018-04-30 NOTE — ED Triage Notes (Signed)
Pt is having left sided dental pain. States he knows he has to get his teeth taken out, but has not.

## 2018-04-30 NOTE — Discharge Instructions (Addendum)
Warm salt water rinses to your mouth.  Take the antibiotic as directed until its finished.  Call your dentist to arrange a follow-up appointment.  Return to the ER for any worsening symptoms

## 2018-09-07 ENCOUNTER — Emergency Department (HOSPITAL_COMMUNITY)
Admission: EM | Admit: 2018-09-07 | Discharge: 2018-09-07 | Disposition: A | Discharge status: Home / Self Care | Payer: Self-pay | Attending: Emergency Medicine | Admitting: Emergency Medicine

## 2018-09-07 ENCOUNTER — Encounter (HOSPITAL_COMMUNITY): Payer: Self-pay | Admitting: Emergency Medicine

## 2018-09-07 DIAGNOSIS — K047 Periapical abscess without sinus: Secondary | ICD-10-CM | POA: Insufficient documentation

## 2018-09-07 DIAGNOSIS — F17200 Nicotine dependence, unspecified, uncomplicated: Secondary | ICD-10-CM | POA: Insufficient documentation

## 2018-09-07 DIAGNOSIS — Z79899 Other long term (current) drug therapy: Secondary | ICD-10-CM | POA: Insufficient documentation

## 2018-09-07 MED ORDER — HYDROCODONE-ACETAMINOPHEN 5-325 MG PO TABS: 1.0000 | ORAL_TABLET | ORAL | 0 refills | Status: DC | PRN

## 2018-09-07 MED ORDER — CLINDAMYCIN HCL 300 MG PO CAPS: 300.0000 mg | ORAL_CAPSULE | Freq: Four times a day (QID) | ORAL | 0 refills | Status: AC

## 2018-09-07 MED ORDER — KETOROLAC TROMETHAMINE 30 MG/ML IJ SOLN
30.0000 mg | Freq: Once | INTRAMUSCULAR | Status: AC
  Administered 2018-09-07: 30 mg via INTRAMUSCULAR
  Filled 2018-09-07: qty 1

## 2018-09-07 MED ORDER — CLINDAMYCIN HCL 150 MG PO CAPS
300.0000 mg | ORAL_CAPSULE | Freq: Once | ORAL | Status: AC
  Administered 2018-09-07: 300 mg via ORAL
  Filled 2018-09-07: qty 2

## 2018-09-07 NOTE — Discharge Instructions (Addendum)
Given Dental Referral guide. Contact your dentist. Consider contacting [employer] for affordable care.  Take Clindamycin as prescribed and complete the full course. For pain: 1) Take 2 Advil Liquid Gels (400mg ) and 1 extra strength Tylenol (500mg ) every 6 hours for pain as needed. IF the above is not working, THEN 2) Take Norco as prescribed in place of the Extra Strength Tylenol. Also take the 2 Advil Liquid Gels.  Continue to brush with a soft tooth brush. Rinse and spit with Listerine after every meal. Return to ER for worsening pain, swelling, fever, pain with movement of your eye or any other concerning symptoms.

## 2018-09-07 NOTE — ED Triage Notes (Signed)
Pt states right upper dental pain with facial swelling since yesterday.  Swelling extends under right eye.

## 2018-09-07 NOTE — ED Provider Notes (Signed)
[facility] EMERGENCY DEPARTMENT Provider Note   CSN: [id number] Arrival date & time: 09/07/18  4436     History   Chief Complaint Chief Complaint  Patient presents with  . Oral Swelling    HPI Gabriel Edwards is a 38 y.o. male.  38yo male with complaint of right side dental pain and facial swelling. Patient reports severe tooth decay, now with pain for the past few days, woke up today with right side facial swelling. Patient reports prior dental abscess, does have a dentist and is working 2 jobs to save to pay for care. Denies fevers, drainage, difficulty opening his mouth, neck pain, difficulty swallowing, changes in vision or pain with movement of the eye. No other complaints or concerns.      Past Medical History:  Diagnosis Date  . MRSA (methicillin resistant staph aureus) culture positive   . MRSA (methicillin resistant Staphylococcus aureus)     There are no active problems to display for this patient.   Past Surgical History:  Procedure Laterality Date  . FRACTURE SURGERY    . LEG SURGERY     for MRSA        Home Medications    Prior to Admission medications   Medication Sig Start Date End Date Taking? Authorizing Provider  chlorhexidine (HIBICLENS) 4 % external liquid Apply topically daily as needed. 02/25/17   Horton, Mayer Masker, MD  clindamycin (CLEOCIN) 300 MG capsule Take 1 capsule (300 mg total) by mouth 4 (four) times daily for 10 days. 09/07/18 09/17/18  Jeannie Fend, PA-C  HYDROcodone-acetaminophen (NORCO/VICODIN) 5-325 MG tablet Take 1 tablet by mouth every 4 (four) hours as needed. 09/07/18   Jeannie Fend, PA-C  mupirocin nasal ointment (BACTROBAN) 2 % Apply in each nostril daily 02/25/17   Horton, Mayer Masker, MD    Family History History reviewed. No pertinent family history.  Social History Social History   Tobacco Use  . Smoking status: Current Some Day Smoker    Packs/day: 1.00    Types: Cigarettes  . Smokeless tobacco: Never Used   Substance Use Topics  . Alcohol use: No  . Drug use: No     Allergies   Sulfa antibiotics   Review of Systems Review of Systems  Constitutional: Negative for fever.  HENT: Positive for dental problem and facial swelling. Negative for ear pain, trouble swallowing and voice change.   Eyes: Negative for pain and visual disturbance.  Skin: Negative for rash and wound.  Allergic/Immunologic: Negative for immunocompromised state.  Neurological: Negative for headaches.  Hematological: Negative for adenopathy.  Psychiatric/Behavioral: Negative for confusion.     Physical Exam Updated Vital Signs BP 111/68 (BP Location: Right Arm)   Pulse 86   Temp 98.2 F (36.8 C) (Oral)   Resp 18   Ht 6\' 1"  (1.854 m)   Wt 93 kg   SpO2 100%   BMI 27.05 kg/m   Physical Exam Vitals signs and nursing note reviewed.  Constitutional:      General: He is not in acute distress.    Appearance: He is well-developed. He is not diaphoretic.  HENT:     Head: Atraumatic.      Nose: Nose normal.     Mouth/Throat:     Mouth: Mucous membranes are moist.     Dentition: Abnormal dentition. Dental tenderness and dental caries present.     Tongue: No lesions. Tongue protrudes in midline.     Pharynx: No oropharyngeal exudate or posterior oropharyngeal  erythema.     Comments: Severe dental decay to the majority of patient's teeth with decay of most teeth to the gumline.  Mild tenderness with questionable early abscess right upper gingiva buccal mucosa.  No active drainage.  No trismus. Eyes:     Extraocular Movements: Extraocular movements intact.     Pupils: Pupils are equal, round, and reactive to light.  Neck:     Musculoskeletal: Neck supple.  Pulmonary:     Effort: Pulmonary effort is normal.  Lymphadenopathy:     Cervical: No cervical adenopathy.  Skin:    General: Skin is warm and dry.     Findings: No erythema or rash.  Neurological:     Mental Status: He is alert and oriented to person,  place, and time.  Psychiatric:        Behavior: Behavior normal.      ED Treatments / Results  Labs (all labs ordered are listed, but only abnormal results are displayed) Labs Reviewed - No data to display  EKG None  Radiology No results found.  Procedures Procedures (including critical care time)  Medications Ordered in ED Medications  clindamycin (CLEOCIN) capsule 300 mg (has no administration in time range)  ketorolac (TORADOL) 30 MG/ML injection 30 mg (has no administration in time range)     Initial Impression / Assessment and Plan / ED Course  I have reviewed the triage vital signs and the nursing notes.  Pertinent labs & imaging results that were available during my care of the patient were reviewed by me and considered in my medical decision making (see chart for details).  Clinical Course as of Sep 07 1048  Mon Sep 07, 2018  3154 38 year old male with complaint of likely dental abscess.  Patient awoke with swelling of his right maxillary area.  Extraocular movements intact, suspect dental abscess secondary to severe dental disease.  Patient was given clindamycin advised to follow-up with a dentist, given dental referral guide information on [employer] [facility].  Patient requests pain medication, reviewed narcotics database shows no recent Rx filled however patient does have history of taking Suboxone.  Due to dental abscess with obvious facial swelling, patient was given 4 tablets of Norco, advised to take this only if taking 2 Advil liquid gels and 1 extra drink Tylenol do not alleviate his pain.  Also recommend brushing with a soft toothbrush and rinsing with Listerine after every meal.  Patient verbalizes understanding and understands return to ER precautions.   [LM]    Clinical Course User Index [LM] Jeannie Fend, PA-C   Final Clinical Impressions(s) / ED Diagnoses   Final diagnoses:  Dental abscess    ED Discharge Orders         Ordered     clindamycin (CLEOCIN) 300 MG capsule  4 times daily     09/07/18 1030    HYDROcodone-acetaminophen (NORCO/VICODIN) 5-325 MG tablet  Every 4 hours PRN     09/07/18 1030           Alden Hipp 09/07/18 1050    Donnetta Hutching, MD 09/08/18 [phone]

## 2019-02-26 ENCOUNTER — Emergency Department (HOSPITAL_COMMUNITY)
Admission: EM | Admit: 2019-02-26 | Discharge: 2019-02-27 | Disposition: A | Discharge status: Home / Self Care | Payer: Self-pay | Attending: Emergency Medicine | Admitting: Emergency Medicine

## 2019-02-26 ENCOUNTER — Encounter (HOSPITAL_COMMUNITY): Payer: Self-pay | Admitting: Emergency Medicine

## 2019-02-26 ENCOUNTER — Other Ambulatory Visit: Payer: Self-pay

## 2019-02-26 DIAGNOSIS — R39198 Other difficulties with micturition: Secondary | ICD-10-CM | POA: Insufficient documentation

## 2019-02-26 DIAGNOSIS — R2243 Localized swelling, mass and lump, lower limb, bilateral: Secondary | ICD-10-CM | POA: Insufficient documentation

## 2019-02-26 DIAGNOSIS — L03818 Cellulitis of other sites: Secondary | ICD-10-CM | POA: Insufficient documentation

## 2019-02-26 DIAGNOSIS — N5089 Other specified disorders of the male genital organs: Secondary | ICD-10-CM | POA: Insufficient documentation

## 2019-02-26 DIAGNOSIS — L0291 Cutaneous abscess, unspecified: Secondary | ICD-10-CM | POA: Insufficient documentation

## 2019-02-26 DIAGNOSIS — F17210 Nicotine dependence, cigarettes, uncomplicated: Secondary | ICD-10-CM | POA: Insufficient documentation

## 2019-02-26 HISTORY — DX: Personal history of peptic ulcer disease: Z87.11

## 2019-02-26 MED ORDER — CLINDAMYCIN PHOSPHATE 600 MG/50ML IV SOLN
600.0000 mg | Freq: Once | INTRAVENOUS | Status: AC
  Administered 2019-02-27: 600 mg via INTRAVENOUS
  Filled 2019-02-26: qty 50

## 2019-02-26 NOTE — ED Triage Notes (Signed)
Pt c/o multiple abscesses on his body, the worst one is in his groin. Pt had recent surgery at [facility] for a "ruptured stomach ulcer."

## 2019-02-27 LAB — COMPREHENSIVE METABOLIC PANEL
ALT: 15 U/L (ref 0–44)
AST: 23 U/L (ref 15–41)
Albumin: 3.9 g/dL (ref 3.5–5.0)
Alkaline Phosphatase: 84 U/L (ref 38–126)
Anion gap: 7 (ref 5–15)
BUN: 6 mg/dL (ref 6–20)
CO2: 29 mmol/L (ref 22–32)
Calcium: 8.6 mg/dL — ABNORMAL LOW (ref 8.9–10.3)
Chloride: 100 mmol/L (ref 98–111)
Creatinine, Ser: 0.75 mg/dL (ref 0.61–1.24)
GFR calc Af Amer: >60 mL/min (ref >60)
GFR calc non Af Amer: >60 mL/min (ref >60)
Glucose, Bld: 85 mg/dL (ref 70–99)
Potassium: 3.8 mmol/L (ref 3.5–5.1)
Sodium: 136 mmol/L (ref 135–145)
Total Bilirubin: 0.7 mg/dL (ref 0.3–1.2)
Total Protein: 6.9 g/dL (ref 6.5–8.1)

## 2019-02-27 LAB — CBC WITH DIFFERENTIAL/PLATELET
Abs Immature Granulocytes: 0.01 10*3/uL (ref 0.00–0.07)
Basophils Absolute: 0.1 10*3/uL (ref 0.0–0.1)
Basophils Relative: 1 %
Eosinophils Absolute: 0.7 10*3/uL — ABNORMAL HIGH (ref 0.0–0.5)
Eosinophils Relative: 10 %
HCT: 40.1 % (ref 39.0–52.0)
Hemoglobin: 13.3 g/dL (ref 13.0–17.0)
Immature Granulocytes: 0 %
Lymphocytes Relative: 32 %
Lymphs Abs: 2.3 10*3/uL (ref 0.7–4.0)
MCH: 28.9 pg (ref 26.0–34.0)
MCHC: 33.2 g/dL (ref 30.0–36.0)
MCV: 87.2 fL (ref 80.0–100.0)
Monocytes Absolute: 0.6 10*3/uL (ref 0.1–1.0)
Monocytes Relative: 8 %
Neutro Abs: 3.4 10*3/uL (ref 1.7–7.7)
Neutrophils Relative %: 49 %
Platelets: 227 10*3/uL (ref 150–400)
RBC: 4.6 MIL/uL (ref 4.22–5.81)
RDW: 12.4 % (ref 11.5–15.5)
WBC: 7.4 10*3/uL (ref 4.0–10.5)
nRBC: 0 % (ref 0.0–0.2)

## 2019-02-27 LAB — URINALYSIS, ROUTINE W REFLEX MICROSCOPIC
Bilirubin Urine: NEGATIVE
Glucose, UA: NEGATIVE mg/dL
Hgb urine dipstick: NEGATIVE
Ketones, ur: NEGATIVE mg/dL
Leukocytes,Ua: NEGATIVE
Nitrite: NEGATIVE
Protein, ur: NEGATIVE mg/dL
Specific Gravity, Urine: 1.003 — ABNORMAL LOW (ref 1.005–1.030)
pH: 6 (ref 5.0–8.0)

## 2019-02-27 MED ORDER — SODIUM CHLORIDE 0.9 % IV BOLUS
1000.0000 mL | Freq: Once | INTRAVENOUS | Status: AC
  Administered 2019-02-27: 01:00:00 1000 mL via INTRAVENOUS

## 2019-02-27 MED ORDER — CLINDAMYCIN HCL 300 MG PO CAPS: 300.0000 mg | ORAL_CAPSULE | Freq: Four times a day (QID) | ORAL | 0 refills | Status: DC

## 2019-02-27 NOTE — ED Provider Notes (Signed)
Emergency Department Provider Note   I have reviewed the triage vital signs and the nursing notes.   HISTORY  Chief Complaint Abscess   HPI Gabriel Edwards is a 38 y.o. male with a somewhat complicated recent medical history who presents to the emergency department secondary to diffuse areas of what appear to be cellulitis and abscess is that her expressed.  Patient states he had some type of abscess a month and half ago found to be related to ruptured stomach ulcers and had surgery.  He had been doing fine on antibiotics and finished antibiotics about a week ago but now has multiple areas on his body where abscesses start to pop up again including his perineal area, arms, legs.  He also states that he is a decreased urination and increased lower extremity swelling with pain and scrotal swelling as well.  No fevers has had some nausea no vomiting.  Decreased intake.  No other associated symptoms.   No other associated or modifying symptoms.    Past Medical History:  Diagnosis Date  . History of stomach ulcers   . MRSA (methicillin resistant staph aureus) culture positive   . MRSA (methicillin resistant Staphylococcus aureus)     There are no active problems to display for this patient.   Past Surgical History:  Procedure Laterality Date  . FRACTURE SURGERY    . LEG SURGERY     for MRSA  . STOMACH SURGERY      Current Outpatient Rx  . Order #: [id number] Class: Print  . Order #: [id number] Class: Print  . Order #: [id number] Class: Normal  . Order #: [id number] Class: Print    Allergies Sulfa antibiotics  No family history on file.  Social History Social History   Tobacco Use  . Smoking status: Current Every Day Smoker    Packs/day: 1.00    Types: Cigarettes  . Smokeless tobacco: Never Used  Substance Use Topics  . Alcohol use: No  . Drug use: No    Review of Systems  All other systems negative except as documented in the HPI. All pertinent positives and  negatives as reviewed in the HPI. ____________________________________________   PHYSICAL EXAM:  VITAL SIGNS: ED Triage Vitals [02/26/19 2254]  Enc Vitals Group     BP 125/84     Pulse Rate 96     Resp 20     Temp 98.8 F (37.1 C)     Temp Source Oral     SpO2 98 %     Weight 205 lb (93 kg)     Height 6\' 1"  (1.854 m)    Constitutional: Alert and oriented. Well appearing and in no acute distress. Eyes: Conjunctivae are normal. PERRL. EOMI. Head: Atraumatic. Nose: No congestion/rhinnorhea. Mouth/Throat: Mucous membranes are moist.  Oropharynx non-erythematous. Neck: No stridor.  No meningeal signs.   Cardiovascular: Normal rate, regular rhythm. Good peripheral circulation. Grossly normal heart sounds.   Respiratory: Normal respiratory effort.  No retractions. Lungs CTAB. GU: scrotal swelling, induration and ttp to perineal area without fluctuance or drainage. Gastrointestinal: Soft and nontender. No distention.  Musculoskeletal: No lower extremity tenderness but has BLE edema with erythhema, pulses intact. No gross deformities of extremities. Neurologic:  Normal speech and language. No gross focal neurologic deficits are appreciated.  Skin:  Skin is warm, dry and intact. No rash noted. Multiple areas of erythema and induration and scabbing on bilateral arms.   ____________________________________________   LABS (all labs ordered are listed, but only abnormal results are  displayed)  Labs Reviewed  CBC WITH DIFFERENTIAL/PLATELET - Abnormal; Notable for the following components:      Result Value   Eosinophils Absolute 0.7 (*)    All other components within normal limits  COMPREHENSIVE METABOLIC PANEL - Abnormal; Notable for the following components:   Calcium 8.6 (*)    All other components within normal limits  URINALYSIS, ROUTINE W REFLEX MICROSCOPIC - Abnormal; Notable for the following components:   Color, Urine STRAW (*)    Specific Gravity, Urine 1.003 (*)    All  other components within normal limits  CULTURE, BLOOD (ROUTINE X 2)  CULTURE, BLOOD (ROUTINE X 2)   ____________________________________________   INITIAL IMPRESSION / ASSESSMENT AND PLAN / ED COURSE  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  No evidence of pulmonary edema W or doubt acute heart failure at this time.  Denies any drug use.  Workup as above. Likely dependent edema at this time. Will encourage raising legs, keeping active, body wash to rid of likely MRSA, urology follow up if area under scrotum not improving and abx.   Pertinent labs & imaging results that were available during my care of the patient were reviewed by me and considered in my medical decision making (see chart for details).  A medical screening exam was performed and I feel the patient has had an appropriate workup for their chief complaint at this time and likelihood of emergent condition existing is low. They have been counseled on decision, discharge, follow up and which symptoms necessitate immediate return to the emergency department. They or their family verbally stated understanding and agreement with plan and discharged in stable condition.   ____________________________________________  FINAL CLINICAL IMPRESSION(S) / ED DIAGNOSES  Final diagnoses:  Cellulitis of other specified site     MEDICATIONS GIVEN DURING THIS VISIT:  Medications  clindamycin (CLEOCIN) IVPB 600 mg (0 mg Intravenous Stopped 02/27/19 0050)  sodium chloride 0.9 % bolus 1,000 mL (0 mLs Intravenous Stopped 02/27/19 0236)     NEW OUTPATIENT MEDICATIONS STARTED DURING THIS VISIT:  Discharge Medication List as of 02/27/2019  2:15 AM    START taking these medications   Details  clindamycin (CLEOCIN) 300 MG capsule Take 1 capsule (300 mg total) by mouth 4  (four) times daily. X 7 days, Starting Sat 02/27/2019, Print        Note:  This note was prepared with assistance of Dragon voice recognition software. Occasional wrong-word or sound-a-like substitutions may have occurred due to the inherent limitations of voice recognition software.   Elbie Statzer, Barbara CowerJason, MD 02/27/19 [phone]

## 2019-03-10 LAB — CULTURE, BLOOD (ROUTINE X 2)
Culture: NO GROWTH
Culture: NO GROWTH
Special Requests: ADEQUATE
Special Requests: ADEQUATE

## 2019-07-28 ENCOUNTER — Emergency Department (HOSPITAL_COMMUNITY): Payer: Self-pay

## 2019-07-28 ENCOUNTER — Encounter (HOSPITAL_COMMUNITY): Payer: Self-pay | Admitting: Emergency Medicine

## 2019-07-28 ENCOUNTER — Inpatient Hospital Stay (HOSPITAL_COMMUNITY)
Admission: EM | Admit: 2019-07-28 | Discharge: 2019-08-01 | DRG: 603 | Disposition: A | Discharge status: Home / Self Care | Payer: Self-pay | Attending: Internal Medicine | Admitting: Internal Medicine

## 2019-07-28 ENCOUNTER — Other Ambulatory Visit: Payer: Self-pay

## 2019-07-28 DIAGNOSIS — F1210 Cannabis abuse, uncomplicated: Secondary | ICD-10-CM | POA: Diagnosis present

## 2019-07-28 DIAGNOSIS — F1910 Other psychoactive substance abuse, uncomplicated: Secondary | ICD-10-CM | POA: Diagnosis present

## 2019-07-28 DIAGNOSIS — L03113 Cellulitis of right upper limb: Principal | ICD-10-CM | POA: Diagnosis present

## 2019-07-28 DIAGNOSIS — F1990 Other psychoactive substance use, unspecified, uncomplicated: Secondary | ICD-10-CM | POA: Diagnosis present

## 2019-07-28 DIAGNOSIS — F1110 Opioid abuse, uncomplicated: Secondary | ICD-10-CM | POA: Diagnosis present

## 2019-07-28 DIAGNOSIS — M659 Synovitis and tenosynovitis, unspecified: Secondary | ICD-10-CM | POA: Diagnosis present

## 2019-07-28 DIAGNOSIS — Z20822 Contact with and (suspected) exposure to covid-19: Secondary | ICD-10-CM | POA: Diagnosis present

## 2019-07-28 DIAGNOSIS — W19XXXA Unspecified fall, initial encounter: Secondary | ICD-10-CM | POA: Diagnosis present

## 2019-07-28 DIAGNOSIS — Z8614 Personal history of Methicillin resistant Staphylococcus aureus infection: Secondary | ICD-10-CM

## 2019-07-28 DIAGNOSIS — F17210 Nicotine dependence, cigarettes, uncomplicated: Secondary | ICD-10-CM | POA: Diagnosis present

## 2019-07-28 DIAGNOSIS — Z882 Allergy status to sulfonamides status: Secondary | ICD-10-CM

## 2019-07-28 LAB — RAPID URINE DRUG SCREEN, HOSP PERFORMED
Amphetamines: POSITIVE — AB
Barbiturates: NOT DETECTED
Benzodiazepines: NOT DETECTED
Cocaine: NOT DETECTED
Opiates: POSITIVE — AB
Tetrahydrocannabinol: POSITIVE — AB

## 2019-07-28 LAB — CBC WITH DIFFERENTIAL/PLATELET
Abs Immature Granulocytes: 0.05 10*3/uL (ref 0.00–0.07)
Basophils Absolute: 0 10*3/uL (ref 0.0–0.1)
Basophils Relative: 0 %
Eosinophils Absolute: 0.1 10*3/uL (ref 0.0–0.5)
Eosinophils Relative: 1 %
HCT: 40 % (ref 39.0–52.0)
Hemoglobin: 13.2 g/dL (ref 13.0–17.0)
Immature Granulocytes: 0 %
Lymphocytes Relative: 14 %
Lymphs Abs: 2 10*3/uL (ref 0.7–4.0)
MCH: 28.9 pg (ref 26.0–34.0)
MCHC: 33 g/dL (ref 30.0–36.0)
MCV: 87.5 fL (ref 80.0–100.0)
Monocytes Absolute: 1.7 10*3/uL — ABNORMAL HIGH (ref 0.1–1.0)
Monocytes Relative: 12 %
Neutro Abs: 10.1 10*3/uL — ABNORMAL HIGH (ref 1.7–7.7)
Neutrophils Relative %: 73 %
Platelets: 345 10*3/uL (ref 150–400)
RBC: 4.57 MIL/uL (ref 4.22–5.81)
RDW: 12.5 % (ref 11.5–15.5)
WBC: 14 10*3/uL — ABNORMAL HIGH (ref 4.0–10.5)
nRBC: 0 % (ref 0.0–0.2)

## 2019-07-28 LAB — BASIC METABOLIC PANEL
Anion gap: 10 (ref 5–15)
BUN: 9 mg/dL (ref 6–20)
CO2: 26 mmol/L (ref 22–32)
Calcium: 8.6 mg/dL — ABNORMAL LOW (ref 8.9–10.3)
Chloride: 99 mmol/L (ref 98–111)
Creatinine, Ser: 0.79 mg/dL (ref 0.61–1.24)
GFR calc Af Amer: >60 mL/min (ref >60)
GFR calc non Af Amer: >60 mL/min (ref >60)
Glucose, Bld: 106 mg/dL — ABNORMAL HIGH (ref 70–99)
Potassium: 4 mmol/L (ref 3.5–5.1)
Sodium: 135 mmol/L (ref 135–145)

## 2019-07-28 LAB — SARS CORONAVIRUS 2 (TAT 6-24 HRS): SARS Coronavirus 2: NEGATIVE

## 2019-07-28 MED ORDER — VANCOMYCIN HCL IN DEXTROSE 1-5 GM/200ML-% IV SOLN: 1000.0000 mg | Freq: Once | INTRAVENOUS | Status: DC

## 2019-07-28 MED ORDER — LACTATED RINGERS IV SOLN: INTRAVENOUS | Status: DC

## 2019-07-28 MED ORDER — ONDANSETRON HCL 4 MG/2ML IJ SOLN: 4.0000 mg | Freq: Four times a day (QID) | INTRAMUSCULAR | Status: DC | PRN

## 2019-07-28 MED ORDER — ONDANSETRON HCL 4 MG/2ML IJ SOLN
4.0000 mg | Freq: Once | INTRAMUSCULAR | Status: AC
Start: 2019-07-28 — End: 2019-07-28
  Administered 2019-07-28: 12:00:00 4 mg via INTRAVENOUS
  Filled 2019-07-28: qty 2

## 2019-07-28 MED ORDER — KETOROLAC TROMETHAMINE 30 MG/ML IJ SOLN
30.0000 mg | Freq: Four times a day (QID) | INTRAMUSCULAR | Status: DC | PRN
  Administered 2019-07-28 – 2019-08-01 (×15): 30 mg via INTRAVENOUS
  Filled 2019-07-28 (×15): qty 1

## 2019-07-28 MED ORDER — PIPERACILLIN-TAZOBACTAM 3.375 G IVPB 30 MIN
3.3750 g | Freq: Once | INTRAVENOUS | Status: AC
  Administered 2019-07-28: 3.375 g via INTRAVENOUS
  Filled 2019-07-28: qty 50

## 2019-07-28 MED ORDER — ACETAMINOPHEN 325 MG PO TABS
650.0000 mg | ORAL_TABLET | Freq: Four times a day (QID) | ORAL | Status: DC | PRN
  Administered 2019-07-28 – 2019-07-31 (×3): 650 mg via ORAL
  Filled 2019-07-28 (×3): qty 2

## 2019-07-28 MED ORDER — PIPERACILLIN-TAZOBACTAM 3.375 G IVPB
3.3750 g | Freq: Three times a day (TID) | INTRAVENOUS | Status: DC
  Administered 2019-07-28 – 2019-08-01 (×11): 3.375 g via INTRAVENOUS
  Filled 2019-07-28 (×12): qty 50

## 2019-07-28 MED ORDER — MORPHINE SULFATE (PF) 4 MG/ML IV SOLN
4.0000 mg | Freq: Once | INTRAVENOUS | Status: AC
  Administered 2019-07-28: 12:00:00 4 mg via INTRAVENOUS
  Filled 2019-07-28: qty 1

## 2019-07-28 MED ORDER — ONDANSETRON HCL 4 MG PO TABS: 4.0000 mg | ORAL_TABLET | Freq: Four times a day (QID) | ORAL | Status: DC | PRN

## 2019-07-28 MED ORDER — ENOXAPARIN SODIUM 40 MG/0.4ML ~~LOC~~ SOLN
40.0000 mg | SUBCUTANEOUS | Status: DC
  Administered 2019-07-28 – 2019-07-31 (×3): 40 mg via SUBCUTANEOUS
  Filled 2019-07-28 (×3): qty 0.4

## 2019-07-28 MED ORDER — HYDROCODONE-ACETAMINOPHEN 5-325 MG PO TABS
1.0000 | ORAL_TABLET | ORAL | Status: DC | PRN
  Administered 2019-07-28 – 2019-07-29 (×6): 1 via ORAL
  Filled 2019-07-28 (×7): qty 1

## 2019-07-28 MED ORDER — PIPERACILLIN-TAZOBACTAM 3.375 G IVPB 30 MIN: 3.3750 g | Freq: Once | INTRAVENOUS | Status: DC

## 2019-07-28 MED ORDER — VANCOMYCIN HCL IN DEXTROSE 1-5 GM/200ML-% IV SOLN
1000.0000 mg | Freq: Once | INTRAVENOUS | Status: AC
  Administered 2019-07-28: 16:00:00 1000 mg via INTRAVENOUS
  Filled 2019-07-28: qty 200

## 2019-07-28 MED ORDER — OXYCODONE HCL 5 MG PO TABS
5.0000 mg | ORAL_TABLET | Freq: Once | ORAL | Status: AC
  Administered 2019-07-29: 5 mg via ORAL
  Filled 2019-07-28: qty 1

## 2019-07-28 MED ORDER — VANCOMYCIN HCL IN DEXTROSE 1-5 GM/200ML-% IV SOLN
1000.0000 mg | Freq: Once | INTRAVENOUS | Status: AC
  Administered 2019-07-28: 12:00:00 1000 mg via INTRAVENOUS
  Filled 2019-07-28: qty 200

## 2019-07-28 MED ORDER — VANCOMYCIN HCL IN DEXTROSE 1-5 GM/200ML-% IV SOLN
1000.0000 mg | Freq: Three times a day (TID) | INTRAVENOUS | Status: DC
  Administered 2019-07-29 – 2019-08-01 (×11): 1000 mg via INTRAVENOUS
  Filled 2019-07-28 (×11): qty 200

## 2019-07-28 MED ORDER — ACETAMINOPHEN 650 MG RE SUPP: 650.0000 mg | Freq: Four times a day (QID) | RECTAL | Status: DC | PRN

## 2019-07-28 NOTE — H&P (Signed)
History and Physical    Gabriel Edwards NWG:[id number] DOB: January 23, 1981 DOA: 07/28/2019  PCP: Patient, No Pcp Per  Patient coming from: Home  I have personally briefly reviewed patient's old medical records in [facility]  Chief Complaint: Right hand swelling and pain  HPI: Gabriel Edwards is a 39 y.o. male with medical history significant of intravenous drug use, previous history of MRSA infections, presents to the hospital with right hand swelling and pain.  Patient reports that he fell on his hand approximately 2 days ago.  He reports that overnight, his hand has become increasingly swollen and painful.  He has erythema on his right hand.  He has not had any fever.  He presents to the emergency room for evaluation.  ED Course: Vitals are stable and basic labs are unrevealing.  He is noted to have marked swelling of his right hand.  X-ray of right hand is unremarkable other than substantial swelling.  Case was discussed with orthopedics who recommended MRI of hand.  This was performed and did not show any underlying abscess, but did comment on tenosynovitis.  He has been referred for admission  Review of Systems:  General: Negative for fever, malaise, weight loss Respiratory: Negative for shortness of breath, cough, wheezing Cardiac: Negative for chest pain, palpitations GI: Negative for nausea, vomiting, diarrhea Musculoskeletal.  Positive for pain in right hand All other systems reviewed and are negative..    Past Medical History:  Diagnosis Date  . History of stomach ulcers   . MRSA (methicillin resistant staph aureus) culture positive   . MRSA (methicillin resistant Staphylococcus aureus)     Past Surgical History:  Procedure Laterality Date  . FRACTURE SURGERY    . LEG SURGERY     for MRSA  . STOMACH SURGERY      Social History:  reports that he has been smoking cigarettes. He has been smoking about 1.00 pack per day. He has never used smokeless tobacco. He  reports that he does not drink alcohol or use drugs.  Allergies  Allergen Reactions  . Sulfa Antibiotics Rash    Family history: Family history reviewed and not pertinent  Prior to Admission medications   Medication Sig Start Date End Date Taking? Authorizing Provider  Ascorbic Acid (VITAMIN C) 1000 MG tablet Take 1,000 mg by mouth daily.   Yes [provider]  Multiple Vitamin (MULTIVITAMIN) tablet Take 1 tablet by mouth daily.   Yes [provider]  chlorhexidine (HIBICLENS) 4 % external liquid Apply topically daily as needed. Patient not taking: Reported on 07/28/2019 02/25/17   Horton, Mayer Masker, MD  clindamycin (CLEOCIN) 300 MG capsule Take 1 capsule (300 mg total) by mouth 4 (four) times daily. X 7 days Patient not taking: Reported on 07/28/2019 02/27/19   Mesner, Barbara Cower, MD  HYDROcodone-acetaminophen (NORCO/VICODIN) 5-325 MG tablet Take 1 tablet by mouth every 4 (four) hours as needed. Patient not taking: Reported on 07/28/2019 09/07/18   Army Melia A, PA-C  mupirocin nasal ointment (BACTROBAN) 2 % Apply in each nostril daily Patient not taking: Reported on 07/28/2019 02/25/17   Shon Baton, MD    Physical Exam: Vitals:   07/28/19 1034 07/28/19 1250 07/28/19 1450 07/28/19 1500  BP: 129/72 126/81 117/71 102/61  Pulse: 87 88 76 80  Resp: 14 16 16 18   Temp: 98.6 F (37 C) 98.3 F (36.8 C) 98.1 F (36.7 C)   TempSrc: Oral Oral Oral   SpO2: 99% 99% 99% 96%  Weight:      Height:        Constitutional: NAD, calm, comfortable Eyes: PERRL, lids and conjunctivae normal ENMT: Mucous membranes are moist. Posterior pharynx clear of any exudate or lesions.Normal dentition.  Neck: normal, supple, no masses, no thyromegaly Respiratory: clear to auscultation bilaterally, no wheezing, no crackles. Normal respiratory effort. No accessory muscle use.  Cardiovascular: Regular rate and rhythm, no murmurs / rubs / gallops. No extremity edema. 2+ pedal pulses. No carotid  bruits.  Abdomen: no tenderness, no masses palpated. No hepatosplenomegaly. Bowel sounds positive.  Musculoskeletal: Significant swelling in right hand with warmth and erythema Skin: no rashes, lesions, ulcers. No induration Neurologic: CN 2-12 grossly intact. Sensation intact, DTR normal. Strength 5/5 in all 4.  Psychiatric: Normal judgment and insight. Alert and oriented x 3. Normal mood.    Labs on Admission: I have personally reviewed following labs and imaging studies  CBC: Recent Labs  Lab 07/28/19 1150  WBC 14.0*  NEUTROABS 10.1*  HGB 13.2  HCT 40.0  MCV 87.5  PLT 345   Basic Metabolic Panel: Recent Labs  Lab 07/28/19 1150  NA 135  K 4.0  CL 99  CO2 26  GLUCOSE 106*  BUN 9  CREATININE 0.79  CALCIUM 8.6*   GFR: Estimated Creatinine Clearance: 144.3 mL/min (by C-G formula based on SCr of 0.79 mg/dL). Liver Function Tests: No results for input(s): AST, ALT, ALKPHOS, BILITOT, PROT, ALBUMIN in the last 168 hours. No results for input(s): LIPASE, AMYLASE in the last 168 hours. No results for input(s): AMMONIA in the last 168 hours. Coagulation Profile: No results for input(s): INR, PROTIME in the last 168 hours. Cardiac Enzymes: No results for input(s): CKTOTAL, CKMB, CKMBINDEX, TROPONINI in the last 168 hours. BNP (last 3 results) No results for input(s): PROBNP in the last 8760 hours. HbA1C: No results for input(s): HGBA1C in the last 72 hours. CBG: No results for input(s): GLUCAP in the last 168 hours. Lipid Profile: No results for input(s): CHOL, HDL, LDLCALC, TRIG, CHOLHDL, LDLDIRECT in the last 72 hours. Thyroid Function Tests: No results for input(s): TSH, T4TOTAL, FREET4, T3FREE, THYROIDAB in the last 72 hours. Anemia Panel: No results for input(s): VITAMINB12, FOLATE, FERRITIN, TIBC, IRON, RETICCTPCT in the last 72 hours. Urine analysis:    Component Value Date/Time   COLORURINE STRAW (A) 02/27/2019 0119   APPEARANCEUR CLEAR 02/27/2019 0119    LABSPEC 1.003 (L) 02/27/2019 0119   PHURINE 6.0 02/27/2019 0119   GLUCOSEU NEGATIVE 02/27/2019 0119   HGBUR NEGATIVE 02/27/2019 0119   BILIRUBINUR NEGATIVE 02/27/2019 0119   KETONESUR NEGATIVE 02/27/2019 0119   PROTEINUR NEGATIVE 02/27/2019 0119   NITRITE NEGATIVE 02/27/2019 0119   LEUKOCYTESUR NEGATIVE 02/27/2019 0119    Radiological Exams on Admission: MR HAND RIGHT WO CONTRAST  Result Date: 07/28/2019 CLINICAL DATA:  Right hand pain and swelling since falling 2 days ago. Worsening erythema and swelling. History of intravenous drug abuse. EXAM: MRI OF THE RIGHT HAND WITHOUT CONTRAST TECHNIQUE: Multiplanar, multisequence MR imaging of the right hand was performed. No intravenous contrast was administered. COMPARISON:  Radiographs 07/28/2019 FINDINGS: Despite efforts by the technologist and patient, mild motion artifact is present on today's exam and could not be eliminated. This reduces exam sensitivity and specificity. The patient was unable to complete the study. No post-contrast imaging was obtained. Bones/Joint/Cartilage No evidence of acute fracture, dislocation or bone destruction. There is nonspecific low-level bone marrow edema within the scaphoid, best seen on the coronal inversion recovery images. No  significant joint effusions or arthropathic changes. Ligaments Ligamentous detail limited by motion and large field of view. No gross ligamentous abnormality or carpal bone diastasis. Muscles and Tendons There is prominent fluid within the extensor digitorum tendon sheaths at the level of the distal carpal row and metacarpal bases. No significant flexor tendon abnormalities identified. No focal fluid collection or significant edema of the hand musculature identified. Soft tissues Severe soft tissue swelling and subcutaneous edema throughout the hand, especially dorsally. No focal fluid collection or foreign body identified on noncontrast imaging. IMPRESSION: 1. Severe soft tissue swelling and  subcutaneous edema throughout the hand, especially dorsally. No focal fluid collection or foreign body identified on noncontrast imaging. 2. Extensor digitorum tenosynovitis, suspicious for infectious tenosynovitis in this clinical context. Orthopedic consultation recommended. 3. No evidence of osteomyelitis or septic joint. Electronically Signed   By: Richardean Sale M.D.   On: 07/28/2019 14:15   DG Hand Complete Right  Result Date: 07/28/2019 CLINICAL DATA:  39 year old male status post fall several days ago with continued right hand pain redness and swelling. EXAM: RIGHT HAND - COMPLETE 3+ VIEW COMPARISON:  None. FINDINGS: Bone mineralization is within normal limits. Moderate to severe generalized soft tissue swelling at the right hand and wrist, especially dorsal. No soft tissue gas identified. No radiopaque foreign body identified. No fracture or dislocation identified in the right hand with normal joint spaces and alignment. IMPRESSION: Moderate to severe right hand and wrist soft tissue swelling with no osseous abnormality identified. Electronically Signed   By: Genevie Ann M.D.   On: 07/28/2019 10:58    Assessment/Plan Active Problems:   Cellulitis of right hand   IVDU (intravenous drug user)   Substance abuse ([place])     1. Cellulitis of right hand.  MRI does not indicate any underlying abscess, but does comment on tenosynovitis.  Orthopedics has been consulted.  Continue on intravenous antibiotics. 2. Substance abuse.  Patient admits to intravenous drug use.  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  Counseled on the importance of abstaining from any recreational drug use.  DVT prophylaxis: Lovenox Code Status: Full code Family Communication: Discussed with patient Disposition Plan: Discharge home once cellulitis has improved Consults called: Orthopedics, Dr. Aline Brochure Admission status: Inpatient, MedSurg  Kathie Dike MD [facility]   If 7PM-7AM, please contact  night-coverage [email or web address]   07/28/2019, 8:28 PM

## 2019-07-28 NOTE — ED Triage Notes (Signed)
Pt states that he fell a couple days ago and hurt his right hand. His right hand is red and swollen.

## 2019-07-28 NOTE — Progress Notes (Signed)
Notified midlevel and md on call of pt complaining of pain mgt not being effective. New order from dr Sharl Ma. Will administer per order.

## 2019-07-28 NOTE — ED Notes (Signed)
Patient refusing Covid test

## 2019-07-28 NOTE — ED Notes (Signed)
Patient returned from MRI.

## 2019-07-28 NOTE — ED Notes (Signed)
Patient taken to MRI

## 2019-07-28 NOTE — ED Notes (Signed)
Report given to Reuel Boom, RN. Patient ready for transport.

## 2019-07-28 NOTE — ED Notes (Signed)
Patient transferred to floor. UDS collected before transfer.

## 2019-07-28 NOTE — ED Notes (Signed)
Dr. Wilkie Aye notified lab was unable to draw second set of blood cultures after multiple attempts. Antibiotics started.

## 2019-07-28 NOTE — Progress Notes (Signed)
Pharmacy Antibiotic Note  Gabriel Edwards is a 39 y.o. male admitted on 07/28/2019 with cellulitis/abscess/hx of MRSA  Pharmacy has been consulted for Vanco/Zosyn dosing.  Plan: Vancomycin 2000 mg IV x 1 dose. Vancomycin 1000 mg IV every 8 hours. Goal trough 15-20 mcg/mL. Zosyn 3.375g IV every 8 hours. Monitor labs, c/s, and vanco level as indicated.   Height: 6\' 1"  (185.4 cm) Weight: 205 lb (93 kg) IBW/kg (Calculated) : 79.9  Temp (24hrs), Avg:98.5 F (36.9 C), Min:98.3 F (36.8 C), Max:98.6 F (37 C)  Recent Labs  Lab 07/28/19 1150  WBC 14.0*  CREATININE 0.79    Estimated Creatinine Clearance: 144.3 mL/min (by C-G formula based on SCr of 0.79 mg/dL).    Allergies  Allergen Reactions  . Sulfa Antibiotics Rash    Antimicrobials this admission: Vanco 1/6 >>  Zosyn 1/6 >>   Dose adjustments this admission: N/A  Microbiology results: 1/6 BCx: pending   Thank you for allowing pharmacy to be a part of this patient's care.  09/25/19, PharmD Clinical Pharmacist 07/28/2019 1:45 PM

## 2019-07-28 NOTE — ED Notes (Signed)
Patient refused to finish MRI.  Was not able to get post contrast due to patient stating he was done and got off the table.

## 2019-07-28 NOTE — ED Notes (Signed)
Patient still in MRI.  

## 2019-07-28 NOTE — ED Provider Notes (Signed)
[facility] EMERGENCY DEPARTMENT Provider Note   CSN: [id number] Arrival date & time: 07/28/19  6967     History Chief Complaint  Patient presents with  . Hand Injury    Gabriel Edwards is a 39 y.o. male.  HPI     This is a 39 year old male with a history of MRSA who presents with right hand pain and swelling.  He reports that he fell 2 days ago and since that time has had worsening redness and swelling of the right hand.  He is not noted any fevers.  He rates his pain a 10 out of 10.  It is worse with movement of the hand.  He denies any IV drug use.  He reports strong history of MRSA cellulitis and abscesses.  Patient has not taken anything for his symptoms.  Patient is right-handed.  Past Medical History:  Diagnosis Date  . History of stomach ulcers   . MRSA (methicillin resistant staph aureus) culture positive   . MRSA (methicillin resistant Staphylococcus aureus)     There are no problems to display for this patient.   Past Surgical History:  Procedure Laterality Date  . FRACTURE SURGERY    . LEG SURGERY     for MRSA  . STOMACH SURGERY         No family history on file.  Social History   Tobacco Use  . Smoking status: Current Every Day Smoker    Packs/day: 1.00    Types: Cigarettes  . Smokeless tobacco: Never Used  Substance Use Topics  . Alcohol use: No  . Drug use: No    Home Medications Prior to Admission medications   Medication Sig Start Date End Date Taking? Authorizing Provider  chlorhexidine (HIBICLENS) 4 % external liquid Apply topically daily as needed. 02/25/17   Kynslie Ringle, Barbette Hair, MD  clindamycin (CLEOCIN) 300 MG capsule Take 1 capsule (300 mg total) by mouth 4 (four) times daily. X 7 days 02/27/19   Mesner, Corene Cornea, MD  HYDROcodone-acetaminophen (NORCO/VICODIN) 5-325 MG tablet Take 1 tablet by mouth every 4 (four) hours as needed. 09/07/18   Tacy Learn, PA-C  mupirocin nasal ointment (BACTROBAN) 2 % Apply in each nostril daily 02/25/17    Adenike Shidler, Barbette Hair, MD    Allergies    Sulfa antibiotics  Review of Systems   Review of Systems  Constitutional: Negative for fever.  Respiratory: Negative for shortness of breath.   Gastrointestinal: Negative for abdominal pain.  Genitourinary: Negative for dysuria.  Musculoskeletal:       Right hand pain  Skin: Positive for color change.  All other systems reviewed and are negative.   Physical Exam Updated Vital Signs BP 126/81 (BP Location: Left Arm)   Pulse 88   Temp 98.3 F (36.8 C) (Oral)   Resp 16   Ht 1.854 m (6\' 1" )   Wt 93 kg   SpO2 99%   BMI 27.05 kg/m   Physical Exam Vitals and nursing note reviewed.  Constitutional:      Appearance: He is well-developed. He is not ill-appearing.  HENT:     Head: Normocephalic and atraumatic.     Mouth/Throat:     Mouth: Mucous membranes are moist.  Eyes:     Pupils: Pupils are equal, round, and reactive to light.  Cardiovascular:     Rate and Rhythm: Normal rate and regular rhythm.  Pulmonary:     Effort: Pulmonary effort is normal. No respiratory distress.  Musculoskeletal:  Cervical back: Neck supple.     Comments: Focused examination of his right hand reveals tense swelling over the dorsum of the hand, there is diffuse tenderness to palpation, there is 1 area of fluctuance over the wrist, scabbing noted over the medial aspect of the wrist, no evidence of fight bite, 1+ radial pulse, limited range of motion secondary to pain  Lymphadenopathy:     Cervical: No cervical adenopathy.  Skin:    General: Skin is warm and dry.     Comments: Erythema extending over the dorsum of the hand into the wrist Track marks bilateral upper extremities  Neurological:     Mental Status: He is alert and oriented to person, place, and time.  Psychiatric:        Mood and Affect: Mood normal.       ED Results / Procedures / Treatments   Labs (all labs ordered are listed, but only abnormal results are displayed) Labs  Reviewed  CBC WITH DIFFERENTIAL/PLATELET - Abnormal; Notable for the following components:      Result Value   WBC 14.0 (*)    Neutro Abs 10.1 (*)    Monocytes Absolute 1.7 (*)    All other components within normal limits  BASIC METABOLIC PANEL - Abnormal; Notable for the following components:   Glucose, Bld 106 (*)    Calcium 8.6 (*)    All other components within normal limits  CULTURE, BLOOD (ROUTINE X 2)  CULTURE, BLOOD (ROUTINE X 2)  SARS CORONAVIRUS 2 (TAT 6-24 HRS)  RAPID URINE DRUG SCREEN, HOSP PERFORMED    EKG None  Radiology DG Hand Complete Right  Result Date: 07/28/2019 CLINICAL DATA:  39 year old male status post fall several days ago with continued right hand pain redness and swelling. EXAM: RIGHT HAND - COMPLETE 3+ VIEW COMPARISON:  None. FINDINGS: Bone mineralization is within normal limits. Moderate to severe generalized soft tissue swelling at the right hand and wrist, especially dorsal. No soft tissue gas identified. No radiopaque foreign body identified. No fracture or dislocation identified in the right hand with normal joint spaces and alignment. IMPRESSION: Moderate to severe right hand and wrist soft tissue swelling with no osseous abnormality identified. Electronically Signed   By: Odessa Fleming M.D.   On: 07/28/2019 10:58    Procedures Procedures (including critical care time)  Angiocath insertion Performed by: Shon Baton  Consent: Verbal consent obtained. Risks and benefits: risks, benefits and alternatives were discussed Time out: Immediately prior to procedure a "time out" was called to verify the correct patient, procedure, equipment, support staff and site/side marked as required.  Preparation: Patient was prepped and draped in the usual sterile fashion.  Vein Location: right basilic  Ultrasound Guided  Gauge: 20  Normal blood return and flush without difficulty Patient tolerance: Patient tolerated the procedure well with no immediate  complications.     Medications Ordered in ED Medications  vancomycin (VANCOCIN) IVPB 1000 mg/200 mL premix (1,000 mg Intravenous New Bag/Given 07/28/19 1225)  morphine 4 MG/ML injection 4 mg (4 mg Intravenous Given 07/28/19 1159)  ondansetron (ZOFRAN) injection 4 mg (4 mg Intravenous Given 07/28/19 1155)    ED Course  I have reviewed the triage vital signs and the nursing notes.  Pertinent labs & imaging results that were available during my care of the patient were reviewed by me and considered in my medical decision making (see chart for details).  Clinical Course as of Jul 27 1249  Wed Jul 28, 2019  1146 When the nurse tried to place IV, it was obvious patient had track marks and was an IV drug user.  He finally admitted to IV drug use.  Reports that he uses heroin for his chronic dental pain.   [CH]  1244 Spoke with Dr. Romeo Apple, hand surgery.  I have reviewed the case with him.  Requesting MRI in case the patient requires I&D.  Agrees with admission and IV antibiotics.   [CH]    Clinical Course User Index [CH] Alexine Pilant, Mayer Masker, MD   MDM Rules/Calculators/A&P                       Patient presents with significant cellulitis of the right hand.  Also likely early abscess of the wrist.  He is nontoxic-appearing.  He denies fevers.  He finally endorsed his IV drug use.  It is clear on exam with track marks in the bilateral arms.  I placed an ultrasound-guided IV.  He was given vancomycin given his history of MRSA.  He has a white count of 14.  Given the extent of the cellulitis and possible abscess, feel he warrants admission.  I did discuss with Dr. Romeo Apple as above.  I have ordered advanced imaging and will plan for admission.  He may need surgical debridement    Final Clinical Impression(s) / ED Diagnoses Final diagnoses:  Cellulitis of right hand    Rx / DC Orders ED Discharge Orders    None       Shon Baton, MD 07/28/19 1252

## 2019-07-29 DIAGNOSIS — L03113 Cellulitis of right upper limb: Principal | ICD-10-CM

## 2019-07-29 DIAGNOSIS — F1910 Other psychoactive substance abuse, uncomplicated: Secondary | ICD-10-CM

## 2019-07-29 DIAGNOSIS — F1990 Other psychoactive substance use, unspecified, uncomplicated: Secondary | ICD-10-CM

## 2019-07-29 LAB — BASIC METABOLIC PANEL
Anion gap: 8 (ref 5–15)
BUN: 11 mg/dL (ref 6–20)
CO2: 27 mmol/L (ref 22–32)
Calcium: 8.5 mg/dL — ABNORMAL LOW (ref 8.9–10.3)
Chloride: 103 mmol/L (ref 98–111)
Creatinine, Ser: 0.83 mg/dL (ref 0.61–1.24)
GFR calc Af Amer: >60 mL/min (ref >60)
GFR calc non Af Amer: >60 mL/min (ref >60)
Glucose, Bld: 96 mg/dL (ref 70–99)
Potassium: 3.8 mmol/L (ref 3.5–5.1)
Sodium: 138 mmol/L (ref 135–145)

## 2019-07-29 LAB — CBC
HCT: 39.6 % (ref 39.0–52.0)
Hemoglobin: 12.7 g/dL — ABNORMAL LOW (ref 13.0–17.0)
MCH: 28.3 pg (ref 26.0–34.0)
MCHC: 32.1 g/dL (ref 30.0–36.0)
MCV: 88.2 fL (ref 80.0–100.0)
Platelets: 330 10*3/uL (ref 150–400)
RBC: 4.49 MIL/uL (ref 4.22–5.81)
RDW: 12.3 % (ref 11.5–15.5)
WBC: 10.1 10*3/uL (ref 4.0–10.5)
nRBC: 0 % (ref 0.0–0.2)

## 2019-07-29 LAB — HIV ANTIBODY (ROUTINE TESTING W REFLEX): HIV Screen 4th Generation wRfx: NONREACTIVE

## 2019-07-29 MED ORDER — OXYCODONE-ACETAMINOPHEN 5-325 MG PO TABS
1.0000 | ORAL_TABLET | ORAL | Status: DC | PRN
  Administered 2019-07-29 – 2019-08-01 (×18): 1 via ORAL
  Filled 2019-07-29 (×18): qty 1

## 2019-07-29 NOTE — Progress Notes (Signed)
PROGRESS NOTE    Gabriel Edwards  FUX:[id number] DOB: [birth year] DOA: 07/28/2019 PCP: Patient, No Pcp Per    Brief Narrative:  Gabriel Edwards is a 39 y.o. male with medical history significant of intravenous drug use, previous history of MRSA infections, presents to the hospital with right hand swelling and pain.  Patient reports that he fell on his hand approximately 2 days ago.  He reports that overnight, his hand has become increasingly swollen and painful.  He has erythema on his right hand.  He has not had any fever.  He presents to the emergency room for evaluation.   Assessment & Plan:   Active Problems:   Cellulitis of right hand   IVDU (intravenous drug user)   Substance abuse (HCC)   1. Cellulitis of right hand.  MRI does not indicate any underlying abscess, but does comment on tenosynovitis.  He is on intravenous antibiotics.  Seen by orthopedics, appreciate input.  If infection causes at abscess, he will likely need incision and drainage.. 2. Substance abuse.  Patient admits to intravenous drug use.  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  Counseled on the importance of abstaining from any recreational drug use.   DVT prophylaxis: Lovenox Code Status: Full code Family Communication: Discussed with patient Disposition Plan: Discharge home once cellulitis has improved   Consultants:   Orthopedics  Procedures:     Antimicrobials:   Vancomycin 1/6>  Zosyn 1/6>   Subjective: Feels her swelling is mildly better but continues to have pain in right hand  Objective: Vitals:   07/28/19 2117 07/29/19 0513 07/29/19 1438 07/29/19 2025  BP: (!) 111/57 (!) 105/56 123/77   Pulse: 67 67 72   Resp: 17 18 17    Temp: 98 F (36.7 C) 97.8 F (36.6 C) 98.3 F (36.8 C)   TempSrc:   Oral   SpO2: 100% 99% 99% 99%  Weight:      Height:        Intake/Output Summary (Last 24 hours) at 07/29/2019 2107 Last data filed at 07/29/2019 1700 Gross per 24 hour  Intake 2158.72 ml  Output  --  Net 2158.72 ml   Filed Weights   07/28/19 1032  Weight: 93 kg    Examination:  General exam: Appears calm and comfortable  Respiratory system: Clear to auscultation. Respiratory effort normal. Cardiovascular system: S1 & S2 heard, RRR. No JVD, murmurs, rubs, gallops or clicks. No pedal edema. Gastrointestinal system: Abdomen is nondistended, soft and nontender. No organomegaly or masses felt. Normal bowel sounds heard. Central nervous system: Alert and oriented. No focal neurological deficits. Extremities: Swelling of right hand. Skin: Erythema and swelling of right hand appears to be localizing in the dorsum Psychiatry: Judgement and insight appear normal. Mood & affect appropriate.     Data Reviewed: I have personally reviewed following labs and imaging studies  CBC: Recent Labs  Lab 07/28/19 1150 07/29/19 0704  WBC 14.0* 10.1  NEUTROABS 10.1*  --   HGB 13.2 12.7*  HCT 40.0 39.6  MCV 87.5 88.2  PLT 345 330   Basic Metabolic Panel: Recent Labs  Lab 07/28/19 1150 07/29/19 0704  NA 135 138  K 4.0 3.8  CL 99 103  CO2 26 27  GLUCOSE 106* 96  BUN 9 11  CREATININE 0.79 0.83  CALCIUM 8.6* 8.5*   GFR: Estimated Creatinine Clearance: 139 mL/min (by C-G formula based on SCr of 0.83 mg/dL). Liver Function Tests: No results for input(s): AST, ALT, ALKPHOS, BILITOT, PROT, ALBUMIN in  the last 168 hours. No results for input(s): LIPASE, AMYLASE in the last 168 hours. No results for input(s): AMMONIA in the last 168 hours. Coagulation Profile: No results for input(s): INR, PROTIME in the last 168 hours. Cardiac Enzymes: No results for input(s): CKTOTAL, CKMB, CKMBINDEX, TROPONINI in the last 168 hours. BNP (last 3 results) No results for input(s): PROBNP in the last 8760 hours. HbA1C: No results for input(s): HGBA1C in the last 72 hours. CBG: No results for input(s): GLUCAP in the last 168 hours. Lipid Profile: No results for input(s): CHOL, HDL, LDLCALC, TRIG,  CHOLHDL, LDLDIRECT in the last 72 hours. Thyroid Function Tests: No results for input(s): TSH, T4TOTAL, FREET4, T3FREE, THYROIDAB in the last 72 hours. Anemia Panel: No results for input(s): VITAMINB12, FOLATE, FERRITIN, TIBC, IRON, RETICCTPCT in the last 72 hours. Sepsis Labs: No results for input(s): PROCALCITON, LATICACIDVEN in the last 168 hours.  Recent Results (from the past 240 hour(s))  SARS CORONAVIRUS 2 (TAT 6-24 HRS) Nasopharyngeal Nasopharyngeal Swab     Status: None   Collection Time: 07/28/19 11:09 AM   Specimen: Nasopharyngeal Swab  Result Value Ref Range Status   SARS Coronavirus 2 NEGATIVE NEGATIVE Final    Comment: (NOTE) SARS-CoV-2 target nucleic acids are NOT DETECTED. The SARS-CoV-2 RNA is generally detectable in upper and lower respiratory specimens during the acute phase of infection. Negative results do not preclude SARS-CoV-2 infection, do not rule out co-infections with other pathogens, and should not be used as the sole basis for treatment or other patient management decisions. Negative results must be combined with clinical observations, patient history, and epidemiological information. The expected result is Negative. Fact Sheet for Patients: HairSlick.no Fact Sheet for Healthcare Providers: quierodirigir.com This test is not yet approved or cleared by the [place] FDA and  has been authorized for detection and/or diagnosis of SARS-CoV-2 by FDA under an Emergency Use Authorization (EUA). This EUA will remain  in effect (meaning this test can be used) for the duration of the COVID-19 declaration under Section 56 4(b)(1) of the Act, 21 U.S.C. section 360bbb-3(b)(1), unless the authorization is terminated or revoked sooner. Performed at [facility], [address], [place], [place] [address]   Blood culture (routine x 2)     Status: None (Preliminary result)   Collection Time: 07/28/19  11:51 AM   Specimen: BLOOD  Result Value Ref Range Status   Specimen Description BLOOD SITE NOT SPECIFIED DRAWN BY RN  Final   Special Requests   Final    BOTTLES DRAWN AEROBIC AND ANAEROBIC Blood Culture adequate volume   Culture   Final    NO GROWTH < 24 HOURS Performed at [facility], [address] [place], [place] [address]    Report Status PENDING  Incomplete  Blood culture (routine x 2)     Status: None (Preliminary result)   Collection Time: 07/28/19 12:20 PM   Specimen: BLOOD RIGHT ARM  Result Value Ref Range Status   Specimen Description BLOOD RIGHT ARM  Final   Special Requests   Final    BOTTLES DRAWN AEROBIC AND ANAEROBIC Blood Culture results may not be optimal due to an excessive volume of blood received in culture bottles   Culture   Final    NO GROWTH < 24 HOURS Performed at [facility], [address], [place], [place] [address]    Report Status PENDING  Incomplete         Radiology Studies: MR HAND RIGHT WO CONTRAST  Result Date: 07/28/2019 CLINICAL DATA:  Right hand pain and swelling since falling 2 days ago. Worsening erythema and swelling. History of intravenous drug abuse. EXAM: MRI OF THE RIGHT HAND WITHOUT CONTRAST TECHNIQUE: Multiplanar, multisequence MR imaging of the right hand was performed. No intravenous contrast was administered. COMPARISON:  Radiographs 07/28/2019 FINDINGS: Despite efforts by the technologist and patient, mild motion artifact is present on today's exam and could not be eliminated. This reduces exam sensitivity and specificity. The patient was unable to complete the study. No post-contrast imaging was obtained. Bones/Joint/Cartilage No evidence of acute fracture, dislocation or bone destruction. There is nonspecific low-level bone marrow edema within the scaphoid, best seen on the coronal inversion recovery images. No significant joint effusions or arthropathic changes. Ligaments Ligamentous detail limited by motion and large field of  view. No gross ligamentous abnormality or carpal bone diastasis. Muscles and Tendons There is prominent fluid within the extensor digitorum tendon sheaths at the level of the distal carpal row and metacarpal bases. No significant flexor tendon abnormalities identified. No focal fluid collection or significant edema of the hand musculature identified. Soft tissues Severe soft tissue swelling and subcutaneous edema throughout the hand, especially dorsally. No focal fluid collection or foreign body identified on noncontrast imaging. IMPRESSION: 1. Severe soft tissue swelling and subcutaneous edema throughout the hand, especially dorsally. No focal fluid collection or foreign body identified on noncontrast imaging. 2. Extensor digitorum tenosynovitis, suspicious for infectious tenosynovitis in this clinical context. Orthopedic consultation recommended. 3. No evidence of osteomyelitis or septic joint. Electronically Signed   By: Richardean Sale M.D.   On: 07/28/2019 14:15   DG Hand Complete Right  Result Date: 07/28/2019 CLINICAL DATA:  39 year old male status post fall several days ago with continued right hand pain redness and swelling. EXAM: RIGHT HAND - COMPLETE 3+ VIEW COMPARISON:  None. FINDINGS: Bone mineralization is within normal limits. Moderate to severe generalized soft tissue swelling at the right hand and wrist, especially dorsal. No soft tissue gas identified. No radiopaque foreign body identified. No fracture or dislocation identified in the right hand with normal joint spaces and alignment. IMPRESSION: Moderate to severe right hand and wrist soft tissue swelling with no osseous abnormality identified. Electronically Signed   By: Genevie Ann M.D.   On: 07/28/2019 10:58        Scheduled Meds: . enoxaparin (LOVENOX) injection  40 mg Subcutaneous Q24H   Continuous Infusions: . lactated ringers 100 mL/hr at 07/29/19 1332  . piperacillin-tazobactam (ZOSYN)  IV 3.375 g (07/29/19 1350)  . vancomycin  1,000 mg (07/29/19 1821)     LOS: 1 day    Time spent: 31mins    Kathie Dike, MD [facility]   If 7PM-7AM, please contact night-coverage [email or web address]  07/29/2019, 9:07 PM

## 2019-07-29 NOTE — Consult Note (Addendum)
Reason for Consult: Dr. Kerry Hough consult for dorsal hand cellulitis   Gabriel Edwards is an 39 y.o. male.  HPI: 39 year old male with history of IV drug abuse presents with a cellulitis on the right hand.  MRI has been obtained patient has extensive dorsal tenosynovitis.  No abscess. He complains of severe nonradiating pain Dorsum of right hand with pain swelling and stiffness   Past Medical History:  Diagnosis Date  . History of stomach ulcers   . MRSA (methicillin resistant staph aureus) culture positive   . MRSA (methicillin resistant Staphylococcus aureus)     Past Surgical History:  Procedure Laterality Date  . FRACTURE SURGERY    . LEG SURGERY     for MRSA  . STOMACH SURGERY      No family history on file.  Social History:  reports that he has been smoking cigarettes. He has been smoking about 1.00 pack per day. He has never used smokeless tobacco. He reports that he does not drink alcohol or use drugs.  Allergies:  Allergies  Allergen Reactions  . Sulfa Antibiotics Rash    Medications: I have reviewed the patient's current medications.  Results for orders placed or performed during the hospital encounter of 07/28/19 (from the past 48 hour(s))  SARS CORONAVIRUS 2 (TAT 6-24 HRS) Nasopharyngeal Nasopharyngeal Swab     Status: None   Collection Time: 07/28/19 11:09 AM   Specimen: Nasopharyngeal Swab  Result Value Ref Range   SARS Coronavirus 2 NEGATIVE NEGATIVE    Comment: (NOTE) SARS-CoV-2 target nucleic acids are NOT DETECTED. The SARS-CoV-2 RNA is generally detectable in upper and lower respiratory specimens during the acute phase of infection. Negative results do not preclude SARS-CoV-2 infection, do not rule out co-infections with other pathogens, and should not be used as the sole basis for treatment or other patient management decisions. Negative results must be combined with clinical observations, patient history, and epidemiological information. The  expected result is Negative. Fact Sheet for Patients: HairSlick.no Fact Sheet for Healthcare Providers: quierodirigir.com This test is not yet approved or cleared by the [place] FDA and  has been authorized for detection and/or diagnosis of SARS-CoV-2 by FDA under an Emergency Use Authorization (EUA). This EUA will remain  in effect (meaning this test can be used) for the duration of the COVID-19 declaration under Section 56 4(b)(1) of the Act, 21 U.S.C. section 360bbb-3(b)(1), unless the authorization is terminated or revoked sooner. Performed at [facility], [address] [place], [place] [address]   CBC with Differential     Status: Abnormal   Collection Time: 07/28/19 11:50 AM  Result Value Ref Range   WBC 14.0 (H) 4.0 - 10.5 K/uL   RBC 4.57 4.22 - 5.81 MIL/uL   Hemoglobin 13.2 13.0 - 17.0 g/dL   HCT 91.6 38.4 - 66.5 %   MCV 87.5 80.0 - 100.0 fL   MCH 28.9 26.0 - 34.0 pg   MCHC 33.0 30.0 - 36.0 g/dL   RDW 99.3 57.0 - 17.7 %   Platelets 345 150 - 400 K/uL   nRBC 0.0 0.0 - 0.2 %   Neutrophils Relative % 73 %   Neutro Abs 10.1 (H) 1.7 - 7.7 K/uL   Lymphocytes Relative 14 %   Lymphs Abs 2.0 0.7 - 4.0 K/uL   Monocytes Relative 12 %   Monocytes Absolute 1.7 (H) 0.1 - 1.0 K/uL   Eosinophils Relative 1 %   Eosinophils Absolute 0.1 0.0 - 0.5 K/uL  Basophils Relative 0 %   Basophils Absolute 0.0 0.0 - 0.1 K/uL   Immature Granulocytes 0 %   Abs Immature Granulocytes 0.05 0.00 - 0.07 K/uL    Comment: Performed at [facility], [address], [place], [place] [address]  Basic metabolic panel     Status: Abnormal   Collection Time: 07/28/19 11:50 AM  Result Value Ref Range   Sodium 135 135 - 145 mmol/L   Potassium 4.0 3.5 - 5.1 mmol/L   Chloride 99 98 - 111 mmol/L   CO2 26 22 - 32 mmol/L   Glucose, Bld 106 (H) 70 - 99 mg/dL   BUN 9 6 - 20 mg/dL   Creatinine, Ser 0.79 0.61 - 1.24 mg/dL   Calcium 8.6 (L) 8.9  - 10.3 mg/dL   GFR calc non Af Amer >60 >60 mL/min   GFR calc Af Amer >60 >60 mL/min   Anion gap 10 5 - 15    Comment: Performed at [facility], [address], [place], [place] [address]  Blood culture (routine x 2)     Status: None (Preliminary result)   Collection Time: 07/28/19 11:51 AM   Specimen: BLOOD  Result Value Ref Range   Specimen Description BLOOD SITE NOT SPECIFIED DRAWN BY RN    Special Requests      BOTTLES DRAWN AEROBIC AND ANAEROBIC Blood Culture adequate volume   Culture      NO GROWTH < 24 HOURS Performed at [facility], [address], [place], [place] [address]    Report Status PENDING   Blood culture (routine x 2)     Status: None (Preliminary result)   Collection Time: 07/28/19 12:20 PM   Specimen: BLOOD RIGHT ARM  Result Value Ref Range   Specimen Description BLOOD RIGHT ARM    Special Requests      BOTTLES DRAWN AEROBIC AND ANAEROBIC Blood Culture results may not be optimal due to an excessive volume of blood received in culture bottles   Culture      NO GROWTH < 24 HOURS Performed at [facility], [address], [place], [place] [address]    Report Status PENDING   Rapid urine drug screen (hospital performed)     Status: Abnormal   Collection Time: 07/28/19  3:17 PM  Result Value Ref Range   Opiates POSITIVE (A) NONE DETECTED   Cocaine NONE DETECTED NONE DETECTED   Benzodiazepines NONE DETECTED NONE DETECTED   Amphetamines POSITIVE (A) NONE DETECTED   Tetrahydrocannabinol POSITIVE (A) NONE DETECTED   Barbiturates NONE DETECTED NONE DETECTED    Comment: (NOTE) DRUG SCREEN FOR MEDICAL PURPOSES ONLY.  IF CONFIRMATION IS NEEDED FOR ANY PURPOSE, NOTIFY LAB WITHIN 5 DAYS. LOWEST DETECTABLE LIMITS FOR URINE DRUG SCREEN Drug Class                     Cutoff (ng/mL) Amphetamine and metabolites    1000 Barbiturate and metabolites    200 Benzodiazepine                 132 Tricyclics and metabolites     300 Opiates and metabolites        300 Cocaine  and metabolites        300 THC                            50 Performed at [facility], [address] [place],  [address]   CBC  Status: Abnormal   Collection Time: 07/29/19  7:04 AM  Result Value Ref Range   WBC 10.1 4.0 - 10.5 K/uL   RBC 4.49 4.22 - 5.81 MIL/uL   Hemoglobin 12.7 (L) 13.0 - 17.0 g/dL   HCT 93.8 10.1 - 75.1 %   MCV 88.2 80.0 - 100.0 fL   MCH 28.3 26.0 - 34.0 pg   MCHC 32.1 30.0 - 36.0 g/dL   RDW 02.5 85.2 - 77.8 %   Platelets 330 150 - 400 K/uL   nRBC 0.0 0.0 - 0.2 %    Comment: Performed at [facility], [address], [place], [place] [address]  Basic metabolic panel     Status: Abnormal   Collection Time: 07/29/19  7:04 AM  Result Value Ref Range   Sodium 138 135 - 145 mmol/L   Potassium 3.8 3.5 - 5.1 mmol/L   Chloride 103 98 - 111 mmol/L   CO2 27 22 - 32 mmol/L   Glucose, Bld 96 70 - 99 mg/dL   BUN 11 6 - 20 mg/dL   Creatinine, Ser 3.61 0.61 - 1.24 mg/dL   Calcium 8.5 (L) 8.9 - 10.3 mg/dL   GFR calc non Af Amer >60 >60 mL/min   GFR calc Af Amer >60 >60 mL/min   Anion gap 8 5 - 15    Comment: Performed at [facility], [address] [place], [place] [address]    MR HAND RIGHT WO CONTRAST  Result Date: 07/28/2019 CLINICAL DATA:  Right hand pain and swelling since falling 2 days ago. Worsening erythema and swelling. History of intravenous drug abuse. EXAM: MRI OF THE RIGHT HAND WITHOUT CONTRAST TECHNIQUE: Multiplanar, multisequence MR imaging of the right hand was performed. No intravenous contrast was administered. COMPARISON:  Radiographs 07/28/2019 FINDINGS: Despite efforts by the technologist and patient, mild motion artifact is present on today's exam and could not be eliminated. This reduces exam sensitivity and specificity. The patient was unable to complete the study. No post-contrast imaging was obtained. Bones/Joint/Cartilage No evidence of acute fracture, dislocation or bone destruction. There is nonspecific low-level bone marrow edema  within the scaphoid, best seen on the coronal inversion recovery images. No significant joint effusions or arthropathic changes. Ligaments Ligamentous detail limited by motion and large field of view. No gross ligamentous abnormality or carpal bone diastasis. Muscles and Tendons There is prominent fluid within the extensor digitorum tendon sheaths at the level of the distal carpal row and metacarpal bases. No significant flexor tendon abnormalities identified. No focal fluid collection or significant edema of the hand musculature identified. Soft tissues Severe soft tissue swelling and subcutaneous edema throughout the hand, especially dorsally. No focal fluid collection or foreign body identified on noncontrast imaging. IMPRESSION: 1. Severe soft tissue swelling and subcutaneous edema throughout the hand, especially dorsally. No focal fluid collection or foreign body identified on noncontrast imaging. 2. Extensor digitorum tenosynovitis, suspicious for infectious tenosynovitis in this clinical context. Orthopedic consultation recommended. 3. No evidence of osteomyelitis or septic joint. Electronically Signed   By: Carey Bullocks M.D.   On: 07/28/2019 14:15   DG Hand Complete Right  Result Date: 07/28/2019 CLINICAL DATA:  39 year old male status post fall several days ago with continued right hand pain redness and swelling. EXAM: RIGHT HAND - COMPLETE 3+ VIEW COMPARISON:  None. FINDINGS: Bone mineralization is within normal limits. Moderate to severe generalized soft tissue swelling at the right hand and wrist, especially dorsal. No soft tissue gas identified. No radiopaque foreign body identified.  No fracture or dislocation identified in the right hand with normal joint spaces and alignment. IMPRESSION: Moderate to severe right hand and wrist soft tissue swelling with no osseous abnormality identified. Electronically Signed   By: [place] Fleming M.D.   On: 07/28/2019 10:58    Review of Systems  Constitutional:  Negative for activity change, appetite change and chills.  Neurological: Negative for numbness.  Hematological: Negative for adenopathy.   Blood pressure (!) 105/56, pulse 67, temperature 97.8 F (36.6 C), resp. rate 18, height 6\' 1"  (1.854 m), weight 93 kg, SpO2 99 %. Physical Exam  Constitutional: He is oriented to person, place, and time. He appears well-developed and well-nourished. No distress.  HENT:  Head: Normocephalic and atraumatic.  Right Ear: External ear normal.  Left Ear: External ear normal.  Mouth/Throat: No oropharyngeal exudate.  Eyes: Pupils are equal, round, and reactive to light. Conjunctivae and EOM are normal. Left eye exhibits no discharge. No scleral icterus.  Neck: No JVD present. No tracheal deviation present. No thyromegaly present.  Cardiovascular: Normal rate and intact distal pulses.  Respiratory: Effort normal and breath sounds normal. No stridor.  GI: Soft. He exhibits no distension.  Musculoskeletal:     Cervical back: Normal range of motion and neck supple.     Comments: Right upper extremity skin is erythematous.  There is 1 area on the dorsum of the distal forearm just proximal to the wrist that is more white than the surrounding erythema which extends 2 to 3 cm proximal to the wrist and then into the dorsum of the hand.  Although he holds the fingers slightly flexed he can extend him and flex thumb actively and with passive range of motion full extension flexion is noted.  All joints are stable.  Muscle tone is normal.  Grip strength is weak most likely second to pain.  Dorsum of the hand is very tender and swollen  Left hand right upper arm no tenderness full range of motion shoulder elbow stable muscle tone normal skin intact no lymphadenopathy  Lymphadenopathy:    He has no cervical adenopathy.  Neurological: He is alert and oriented to person, place, and time. He has normal reflexes. He exhibits normal muscle tone.  Skin: He is not diaphoretic.   Psychiatric: He has a normal mood and affect. His behavior is normal. Judgment and thought content normal.    Assessment/Plan: Right hand dorsal tenosynovitis  I looked at his MRI and I interpret this as there is no evidence of abscess there is soft tissue swelling there is probable tenosynovitis.  Patient says that he is improving on antibiotics  Continue antibiotics  If patient's condition does not improve or abscess develops then incision and drainage would be necessary.  07/29/2019, 1:13 PM

## 2019-07-30 LAB — CREATININE, SERUM
Creatinine, Ser: 0.8 mg/dL (ref 0.61–1.24)
GFR calc Af Amer: >60 mL/min (ref >60)
GFR calc non Af Amer: >60 mL/min (ref >60)

## 2019-07-30 MED ORDER — NICOTINE 21 MG/24HR TD PT24
21.0000 mg | MEDICATED_PATCH | Freq: Every day | TRANSDERMAL | Status: DC
  Administered 2019-07-30 – 2019-08-01 (×3): 21 mg via TRANSDERMAL
  Filled 2019-07-30 (×3): qty 1

## 2019-07-30 NOTE — Progress Notes (Signed)
PROGRESS NOTE    RED MANDT  PIR:[id number] DOB: June 04, 1981 DOA: 07/28/2019 PCP: Patient, No Pcp Per    Brief Narrative:  Gabriel Edwards is a 39 y.o. male with medical history significant of intravenous drug use, previous history of MRSA infections, presents to the hospital with right hand swelling and pain.  Patient reports that he fell on his hand approximately 2 days ago.  He reports that overnight, his hand has become increasingly swollen and painful.  He has erythema on his right hand.  He has not had any fever.  He presents to the emergency room for evaluation.   Assessment & Plan:   Active Problems:   Cellulitis of right hand   IVDU (intravenous drug user)   Substance abuse (HCC)   1. Cellulitis of right hand.  MRI does not indicate any underlying abscess, but does comment on tenosynovitis.  He is on intravenous antibiotics.  Seen by orthopedics, appreciate input.  Infection is coalescing into abscess on dorsum of right hand.  Will likely need incision and drainage. 2. Substance abuse.  Patient admits to intravenous drug use.  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  Counseled on the importance of abstaining from any recreational drug use.   DVT prophylaxis: Lovenox Code Status: Full code Family Communication: Discussed with patient Disposition Plan: Discharge home once cellulitis has improved   Consultants:   Orthopedics  Procedures:     Antimicrobials:   Vancomycin 1/6>  Zosyn 1/6>   Subjective: Continues to have pain in his right hand.  Overall feels swelling is improving.  Objective: Vitals:   07/29/19 2025 07/29/19 2112 07/30/19 0558 07/30/19 1330  BP:  (!) 117/58 110/63 121/69  Pulse:  74 73 79  Resp:  18 17 19   Temp:  98.7 F (37.1 C) 97.7 F (36.5 C) 98 F (36.7 C)  TempSrc:  Oral Oral Oral  SpO2: 99% 99% 97% 98%  Weight:      Height:        Intake/Output Summary (Last 24 hours) at 07/30/2019 1927 Last data filed at 07/30/2019 1300 Gross per 24  hour  Intake 840 ml  Output --  Net 840 ml   Filed Weights   07/28/19 1032  Weight: 93 kg    Examination:  General exam: Appears calm and comfortable  Respiratory system: Clear to auscultation. Respiratory effort normal. Cardiovascular system: S1 & S2 heard, RRR. No JVD, murmurs, rubs, gallops or clicks. No pedal edema. Gastrointestinal system: Abdomen is nondistended, soft and nontender. No organomegaly or masses felt. Normal bowel sounds heard. Central nervous system: Alert and oriented. No focal neurological deficits. Extremities: Right hand swelling improving Skin: Erythema in right hand improving.  Infection appears to be coalescing into large abscess over dorsum of right hand. Psychiatry: Judgement and insight appear normal. Mood & affect appropriate.     Data Reviewed: I have personally reviewed following labs and imaging studies  CBC: Recent Labs  Lab 07/28/19 1150 07/29/19 0704  WBC 14.0* 10.1  NEUTROABS 10.1*  --   HGB 13.2 12.7*  HCT 40.0 39.6  MCV 87.5 88.2  PLT 345 330   Basic Metabolic Panel: Recent Labs  Lab 07/28/19 1150 07/29/19 0704 07/30/19 0638  NA 135 138  --   K 4.0 3.8  --   CL 99 103  --   CO2 26 27  --   GLUCOSE 106* 96  --   BUN 9 11  --   CREATININE 0.79 0.83 0.80  CALCIUM 8.6* 8.5*  --  GFR: Estimated Creatinine Clearance: 144.3 mL/min (by C-G formula based on SCr of 0.8 mg/dL). Liver Function Tests: No results for input(s): AST, ALT, ALKPHOS, BILITOT, PROT, ALBUMIN in the last 168 hours. No results for input(s): LIPASE, AMYLASE in the last 168 hours. No results for input(s): AMMONIA in the last 168 hours. Coagulation Profile: No results for input(s): INR, PROTIME in the last 168 hours. Cardiac Enzymes: No results for input(s): CKTOTAL, CKMB, CKMBINDEX, TROPONINI in the last 168 hours. BNP (last 3 results) No results for input(s): PROBNP in the last 8760 hours. HbA1C: No results for input(s): HGBA1C in the last 72  hours. CBG: No results for input(s): GLUCAP in the last 168 hours. Lipid Profile: No results for input(s): CHOL, HDL, LDLCALC, TRIG, CHOLHDL, LDLDIRECT in the last 72 hours. Thyroid Function Tests: No results for input(s): TSH, T4TOTAL, FREET4, T3FREE, THYROIDAB in the last 72 hours. Anemia Panel: No results for input(s): VITAMINB12, FOLATE, FERRITIN, TIBC, IRON, RETICCTPCT in the last 72 hours. Sepsis Labs: No results for input(s): PROCALCITON, LATICACIDVEN in the last 168 hours.  Recent Results (from the past 240 hour(s))  SARS CORONAVIRUS 2 (TAT 6-24 HRS) Nasopharyngeal Nasopharyngeal Swab     Status: None   Collection Time: 07/28/19 11:09 AM   Specimen: Nasopharyngeal Swab  Result Value Ref Range Status   SARS Coronavirus 2 NEGATIVE NEGATIVE Final    Comment: (NOTE) SARS-CoV-2 target nucleic acids are NOT DETECTED. The SARS-CoV-2 RNA is generally detectable in upper and lower respiratory specimens during the acute phase of infection. Negative results do not preclude SARS-CoV-2 infection, do not rule out co-infections with other pathogens, and should not be used as the sole basis for treatment or other patient management decisions. Negative results must be combined with clinical observations, patient history, and epidemiological information. The expected result is Negative. Fact Sheet for Patients: HairSlick.no Fact Sheet for Healthcare Providers: quierodirigir.com This test is not yet approved or cleared by the [place] FDA and  has been authorized for detection and/or diagnosis of SARS-CoV-2 by FDA under an Emergency Use Authorization (EUA). This EUA will remain  in effect (meaning this test can be used) for the duration of the COVID-19 declaration under Section 56 4(b)(1) of the Act, 21 U.S.C. section 360bbb-3(b)(1), unless the authorization is terminated or revoked sooner. Performed at [facility],  [address], [place], [place] [address]   Blood culture (routine x 2)     Status: None (Preliminary result)   Collection Time: 07/28/19 11:51 AM   Specimen: BLOOD  Result Value Ref Range Status   Specimen Description BLOOD SITE NOT SPECIFIED DRAWN BY RN  Final   Special Requests   Final    BOTTLES DRAWN AEROBIC AND ANAEROBIC Blood Culture adequate volume   Culture   Final    NO GROWTH 2 DAYS Performed at [facility], [address], [place], [place] [address]    Report Status PENDING  Incomplete  Blood culture (routine x 2)     Status: None (Preliminary result)   Collection Time: 07/28/19 12:20 PM   Specimen: BLOOD RIGHT ARM  Result Value Ref Range Status   Specimen Description BLOOD RIGHT ARM  Final   Special Requests   Final    BOTTLES DRAWN AEROBIC AND ANAEROBIC Blood Culture results may not be optimal due to an excessive volume of blood received in culture bottles   Culture   Final    NO GROWTH 2 DAYS Performed at [facility], [address]  [place],  [address]    Report Status PENDING  Incomplete         Radiology Studies: No results found.      Scheduled Meds: . enoxaparin (LOVENOX) injection  40 mg Subcutaneous Q24H   Continuous Infusions: . lactated ringers 100 mL/hr at 07/30/19 1241  . piperacillin-tazobactam (ZOSYN)  IV 3.375 g (07/30/19 1244)  . vancomycin 1,000 mg (07/30/19 1747)     LOS: 2 days    Time spent: 7mins    Kathie Dike, MD [facility]   If 7PM-7AM, please contact night-coverage [email or web address]  07/30/2019, 7:27 PM

## 2019-07-31 NOTE — Progress Notes (Signed)
Pharmacy Antibiotic Note  Gabriel Edwards is a 39 y.o. male admitted on 07/28/2019 with cellulitis/abscess/hx of MRSA  Pharmacy has been consulted for Vanco/Zosyn dosing. Patient admits to intravenous drug use. H/O previous infections. MRI does not indicate any underlying abscess, but does comment on tenosynovitis. Infection is coalescing into abscess on dorsum of right hand. Will likely need I&D per MD.  Vancomycin Tr levels to be ordered 1/10. Scr stable Plan: Continue Vancomycin 1000 mg IV every 8 hours. Goal trough 15-20 mcg/mL. Zosyn 3.375g IV every 8 hours. Monitor labs, c/s, and vanco level as indicated.  Height: 6\' 1"  (185.4 cm) Weight: 205 lb (93 kg) IBW/kg (Calculated) : 79.9  Temp (24hrs), Avg:98.2 F (36.8 C), Min:98.1 F (36.7 C), Max:98.2 F (36.8 C)  Recent Labs  Lab 07/28/19 1150 07/29/19 0704 07/30/19 0638  WBC 14.0* 10.1  --   CREATININE 0.79 0.83 0.80    Estimated Creatinine Clearance: 144.3 mL/min (by C-G formula based on SCr of 0.8 mg/dL).    Allergies  Allergen Reactions  . Sulfa Antibiotics Rash    Antimicrobials this admission: Vanco 1/6 >>  Zosyn 1/6 >>   Dose adjustments this admission: N/A  Microbiology results: 1/6 BCx: ngtd  Thank you for allowing pharmacy to be a part of this patient's care.  09/27/19, BS Pharm D, BCPS Clinical Pharmacist Pager [phone] 07/31/2019 1:50 PM

## 2019-07-31 NOTE — Progress Notes (Signed)
PROGRESS NOTE    Gabriel Edwards  TFT:[id number] DOB: 1980-10-02 DOA: 07/28/2019 PCP: Patient, No Pcp Per    Brief Narrative:  Gabriel Edwards is a 39 y.o. male with medical history significant of intravenous drug use, previous history of MRSA infections, presents to the hospital with right hand swelling and pain.  Patient reports that he fell on his hand approximately 2 days ago.  He reports that overnight, his hand has become increasingly swollen and painful.  He has erythema on his right hand.  He has not had any fever.  He presents to the emergency room for evaluation.   Assessment & Plan:   Active Problems:   Cellulitis of right hand   IVDU (intravenous drug user)   Substance abuse ([place])   1. Cellulitis of right hand.  MRI does not indicate any underlying abscess, but does comment on tenosynovitis.  He is on intravenous antibiotics.  Seen by orthopedics, appreciate input.  Infection is coalescing into abscess on dorsum of right hand.  This began spontaneously drain overnight.  Continue to monitor on antibiotics.. 2. Substance abuse.  Patient admits to intravenous drug use.  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  Counseled on the importance of abstaining from any recreational drug use.   DVT prophylaxis: Lovenox Code Status: Full code Family Communication: Discussed with patient Disposition Plan: Discharge home once cellulitis has improved   Consultants:   Orthopedics  Procedures:     Antimicrobials:   Vancomycin 1/6>  Zosyn 1/6>   Subjective: Reports that blister on dorsum of right hand began draining overnight.  Right hand feels less tight.  Swelling continues to improve.  Objective: Vitals:   07/30/19 1330 07/30/19 2046 07/30/19 2117 07/31/19 0522  BP: 121/69  125/76 116/82  Pulse: 79  67 70  Resp: 19  17 18   Temp: 98 F (36.7 C)  98.1 F (36.7 C) 98.2 F (36.8 C)  TempSrc: Oral     SpO2: 98% 98%  100%  Weight:      Height:        Intake/Output Summary  (Last 24 hours) at 07/31/2019 1903 Last data filed at 07/31/2019 1700 Gross per 24 hour  Intake 2360 ml  Output --  Net 2360 ml   Filed Weights   07/28/19 1032  Weight: 93 kg    Examination:  General exam: Appears calm and comfortable  Respiratory system: Clear to auscultation. Respiratory effort normal. Cardiovascular system: S1 & S2 heard, RRR. No JVD, murmurs, rubs, gallops or clicks. No pedal edema. Gastrointestinal system: Abdomen is nondistended, soft and nontender. No organomegaly or masses felt. Normal bowel sounds heard. Central nervous system: Alert and oriented. No focal neurological deficits. Extremities: Right hand swelling improving Skin: as per photo below Psychiatry: Judgement and insight appear normal. Mood & affect appropriate.       Data Reviewed: I have personally reviewed following labs and imaging studies  CBC: Recent Labs  Lab 07/28/19 1150 07/29/19 0704  WBC 14.0* 10.1  NEUTROABS 10.1*  --   HGB 13.2 12.7*  HCT 40.0 39.6  MCV 87.5 88.2  PLT 345 706   Basic Metabolic Panel: Recent Labs  Lab 07/28/19 1150 07/29/19 0704 07/30/19 0638  NA 135 138  --   K 4.0 3.8  --   CL 99 103  --   CO2 26 27  --   GLUCOSE 106* 96  --   BUN 9 11  --   CREATININE 0.79 0.83 0.80  CALCIUM 8.6* 8.5*  --  GFR: Estimated Creatinine Clearance: 144.3 mL/min (by C-G formula based on SCr of 0.8 mg/dL). Liver Function Tests: No results for input(s): AST, ALT, ALKPHOS, BILITOT, PROT, ALBUMIN in the last 168 hours. No results for input(s): LIPASE, AMYLASE in the last 168 hours. No results for input(s): AMMONIA in the last 168 hours. Coagulation Profile: No results for input(s): INR, PROTIME in the last 168 hours. Cardiac Enzymes: No results for input(s): CKTOTAL, CKMB, CKMBINDEX, TROPONINI in the last 168 hours. BNP (last 3 results) No results for input(s): PROBNP in the last 8760 hours. HbA1C: No results for input(s): HGBA1C in the last 72 hours. CBG: No  results for input(s): GLUCAP in the last 168 hours. Lipid Profile: No results for input(s): CHOL, HDL, LDLCALC, TRIG, CHOLHDL, LDLDIRECT in the last 72 hours. Thyroid Function Tests: No results for input(s): TSH, T4TOTAL, FREET4, T3FREE, THYROIDAB in the last 72 hours. Anemia Panel: No results for input(s): VITAMINB12, FOLATE, FERRITIN, TIBC, IRON, RETICCTPCT in the last 72 hours. Sepsis Labs: No results for input(s): PROCALCITON, LATICACIDVEN in the last 168 hours.  Recent Results (from the past 240 hour(s))  SARS CORONAVIRUS 2 (TAT 6-24 HRS) Nasopharyngeal Nasopharyngeal Swab     Status: None   Collection Time: 07/28/19 11:09 AM   Specimen: Nasopharyngeal Swab  Result Value Ref Range Status   SARS Coronavirus 2 NEGATIVE NEGATIVE Final    Comment: (NOTE) SARS-CoV-2 target nucleic acids are NOT DETECTED. The SARS-CoV-2 RNA is generally detectable in upper and lower respiratory specimens during the acute phase of infection. Negative results do not preclude SARS-CoV-2 infection, do not rule out co-infections with other pathogens, and should not be used as the sole basis for treatment or other patient management decisions. Negative results must be combined with clinical observations, patient history, and epidemiological information. The expected result is Negative. Fact Sheet for Patients: HairSlick.no Fact Sheet for Healthcare Providers: quierodirigir.com This test is not yet approved or cleared by the [place] FDA and  has been authorized for detection and/or diagnosis of SARS-CoV-2 by FDA under an Emergency Use Authorization (EUA). This EUA will remain  in effect (meaning this test can be used) for the duration of the COVID-19 declaration under Section 56 4(b)(1) of the Act, 21 U.S.C. section 360bbb-3(b)(1), unless the authorization is terminated or revoked sooner. Performed at [facility], [address],  [place], [place] [address]   Blood culture (routine x 2)     Status: None (Preliminary result)   Collection Time: 07/28/19 11:51 AM   Specimen: BLOOD  Result Value Ref Range Status   Specimen Description BLOOD SITE NOT SPECIFIED DRAWN BY RN  Final   Special Requests   Final    BOTTLES DRAWN AEROBIC AND ANAEROBIC Blood Culture adequate volume   Culture   Final    NO GROWTH 3 DAYS Performed at [facility], [address], [place], [place] [address]    Report Status PENDING  Incomplete  Blood culture (routine x 2)     Status: None (Preliminary result)   Collection Time: 07/28/19 12:20 PM   Specimen: BLOOD RIGHT ARM  Result Value Ref Range Status   Specimen Description BLOOD RIGHT ARM  Final   Special Requests   Final    BOTTLES DRAWN AEROBIC AND ANAEROBIC Blood Culture results may not be optimal due to an excessive volume of blood received in culture bottles   Culture   Final    NO GROWTH 3 DAYS Performed at [facility], [address]  [place], [place] [address]    Report Status PENDING  Incomplete         Radiology Studies: No results found.      Scheduled Meds: . enoxaparin (LOVENOX) injection  40 mg Subcutaneous Q24H  . nicotine  21 mg Transdermal Daily   Continuous Infusions: . lactated ringers 100 mL/hr at 07/31/19 1739  . piperacillin-tazobactam (ZOSYN)  IV 3.375 g (07/31/19 1321)  . vancomycin 1,000 mg (07/31/19 1741)     LOS: 3 days    Time spent:    Erick Blinks, MD [facility]   If 7PM-7AM, please contact night-coverage [email or web address]  07/31/2019, 7:03 PM

## 2019-07-31 NOTE — Progress Notes (Signed)
Hand began draining mostly sanguinous with some purulent drainage this morning.

## 2019-08-01 LAB — VANCOMYCIN, TROUGH: Vancomycin Tr: 19 ug/mL (ref 15–20)

## 2019-08-01 MED ORDER — VANCOMYCIN HCL 1250 MG/250ML IV SOLN: 1250.0000 mg | Freq: Two times a day (BID) | INTRAVENOUS | Status: DC

## 2019-08-01 MED ORDER — DOXYCYCLINE HYCLATE 100 MG PO CAPS: 100.0000 mg | ORAL_CAPSULE | Freq: Two times a day (BID) | ORAL | 0 refills | Status: AC

## 2019-08-01 MED ORDER — IBUPROFEN 600 MG PO TABS: 600.0000 mg | ORAL_TABLET | Freq: Four times a day (QID) | ORAL | 0 refills | Status: AC | PRN

## 2019-08-01 NOTE — Discharge Instructions (Signed)
Soak warm water + salt + drop of dish detergent 15 min twice a day     Cellulitis, Adult  Cellulitis is a skin infection. The infected area is often warm, red, swollen, and sore. It occurs most often in the arms and lower legs. It is very important to get treated for this condition. What are the causes? This condition is caused by bacteria. The bacteria enter through a break in the skin, such as a cut, burn, insect bite, open sore, or crack. What increases the risk? This condition is more likely to occur in people who:  Have a weak body defense system (immune system).  Have open cuts, burns, bites, or scrapes on the skin.  Are older than 37 years of age.  Have a blood sugar problem (diabetes).  Have a long-lasting (chronic) liver disease (cirrhosis) or kidney disease.  Are very overweight (obese).  Have a skin problem, such as: ? Itchy rash (eczema). ? Slow movement of blood in the veins (venous stasis). ? Fluid buildup below the skin (edema).  Have been treated with high-energy rays (radiation).  Use IV drugs. What are the signs or symptoms? Symptoms of this condition include:  Skin that is: ? Red. ? Streaking. ? Spotting. ? Swollen. ? Sore or painful when you touch it. ? Warm.  A fever.  Chills.  Blisters. How is this diagnosed? This condition is diagnosed based on:  Medical history.  Physical exam.  Blood tests.  Imaging tests. How is this treated? Treatment for this condition may include:  Medicines to treat infections or allergies.  Home care, such as: ? Rest. ? Placing cold or warm cloths (compresses) on the skin.  Hospital care, if the condition is very bad. Follow these instructions at home: Medicines  Take over-the-counter and prescription medicines only as told by your doctor.  If you were prescribed an antibiotic medicine, take it as told by your doctor. Do not stop taking it even if you start to feel better. General  instructions   Drink enough fluid to keep your pee (urine) pale yellow.  Do not touch or rub the infected area.  Raise (elevate) the infected area above the level of your heart while you are sitting or lying down.  Place cold or warm cloths on the area as told by your doctor.  Keep all follow-up visits as told by your doctor. This is important. Contact a doctor if:  You have a fever.  You do not start to get better after 1-2 days of treatment.  Your bone or joint under the infected area starts to hurt after the skin has healed.  Your infection comes back. This can happen in the same area or another area.  You have a swollen bump in the area.  You have new symptoms.  You feel ill and have muscle aches and pains. Get help right away if:  Your symptoms get worse.  You feel very sleepy.  You throw up (vomit) or have watery poop (diarrhea) for a long time.  You see red streaks coming from the area.  Your red area gets larger.  Your red area turns dark in color. These symptoms may represent a serious problem that is an emergency. Do not wait to see if the symptoms will go away. Get medical help right away. Call your local emergency services (911 in the [place] Do not drive yourself to the hospital. Summary  Cellulitis is a skin infection. The area is often warm, red, swollen, and  sore.  This condition is treated with medicines, rest, and cold and warm cloths.  Take all medicines only as told by your doctor.  Tell your doctor if symptoms do not start to get better after 1-2 days of treatment. This information is not intended to replace advice given to you by your health care provider. Make sure you discuss any questions you have with your health care provider. Document Revised: 11/27/2017 Document Reviewed: 11/27/2017 Elsevier Patient Education  [place].

## 2019-08-01 NOTE — Progress Notes (Signed)
Nsg Discharge Note  Admit Date:  07/28/2019 Discharge date: 08/01/2019   Edwards Gabriel to be D/C'd Home per MD order.  AVS completed.  Copy for chart, and copy for patient signed, and dated. Patient/caregiver able to verbalize understanding.  Discharge Medication: Allergies as of 08/01/2019      Reactions   Sulfa Antibiotics Rash      Medication List    STOP taking these medications   chlorhexidine 4 % external liquid Commonly known as: Hibiclens   clindamycin 300 MG capsule Commonly known as: CLEOCIN   HYDROcodone-acetaminophen 5-325 MG tablet Commonly known as: NORCO/VICODIN   multivitamin tablet   mupirocin nasal ointment 2 % Commonly known as: BACTROBAN   vitamin C 1000 MG tablet     TAKE these medications   doxycycline 100 MG capsule Commonly known as: VIBRAMYCIN Take 1 capsule (100 mg total) by mouth 2 (two) times daily.   ibuprofen 600 MG tablet Commonly known as: ADVIL Take 1 tablet (600 mg total) by mouth every 6 (six) hours as needed.       Discharge Assessment: Vitals:   08/01/19 0452 08/01/19 1413  BP: 117/62 127/82  Pulse: (!) 59 69  Resp: 18 16  Temp: (!) 97.4 F (36.3 C) 97.9 F (36.6 C)  SpO2: 99% 100%   Skin clean, dry and intact without evidence of skin break down, no evidence of skin tears noted. IV catheter discontinued intact. Site without signs and symptoms of complications - no redness or edema noted at insertion site, patient denies c/o pain - only slight tenderness at site.  Dressing with slight pressure applied.  D/c Instructions-Education: Discharge instructions given to patient with verbalized understanding. D/c education completed with patient including follow up instructions, medication list, d/c activities limitations if indicated, with other d/c instructions as indicated by MD - patient able to verbalize understanding, all questions fully answered. Patient instructed to return to ED, call 911, or call MD for any changes in  condition.  Patient escorted via WC, and D/C home via private auto.  Lonn Georgia, RN 08/01/2019 2:24 PM

## 2019-08-01 NOTE — Discharge Summary (Signed)
Physician Discharge Summary  Gabriel Edwards DGL:[id number] DOB: July 03, 1981 DOA: 07/28/2019  PCP: Patient, No Pcp Per  Admit date: 07/28/2019 Discharge date: 08/01/2019  Admitted From: Home Disposition: Home  Recommendations for Outpatient Follow-up:  1. Follow-up with orthopedics on Friday  Discharge Condition: Stable CODE STATUS: Full code Diet recommendation: Regular diet  Brief/Interim Summary: 39 year old male with history of intravenous drug use, history of MRSA infections, presents to the hospital with right hand swelling and pain.  He reports that he had fallen on his hand approximately 2 days prior to admission.  Overnight, his hand had become increasingly swollen and painful.  He had erythema on his right hand.  He was evaluated emergency room and noted to have a significant cellulitis with concern for underlying abscess.  Plain films and MRI imaging did not indicate any abscess.  He was seen by orthopedics and continued on intravenous antibiotics.  As his condition improved, is cellulitis began to coalesce in a blister/abscess.  This spontaneously ruptured and is now draining.  He was followed by orthopedics and did not feel that he needed any further intervention at this time.  It was recommended he soak his hand in salt water for 15 minutes twice a day and keep the wound clean.  He will be continued on oral antibiotics and follow-up with orthopedics in the next 5 days.  Patient was counseled on importance of quitting recreational drug use.  He reports that he used to see an addiction clinic in the past plans to follow-up there.  Discharge Diagnoses:  Active Problems:   Cellulitis of right hand   IVDU (intravenous drug user)   Substance abuse [facility])    Discharge Instructions  Discharge Instructions    Diet - low sodium heart healthy   Complete by: As directed    Increase activity slowly   Complete by: As directed      Allergies as of 08/01/2019      Reactions   Sulfa  Antibiotics Rash      Medication List    STOP taking these medications   chlorhexidine 4 % external liquid Commonly known as: Hibiclens   clindamycin 300 MG capsule Commonly known as: CLEOCIN   HYDROcodone-acetaminophen 5-325 MG tablet Commonly known as: NORCO/VICODIN   multivitamin tablet   mupirocin nasal ointment 2 % Commonly known as: BACTROBAN   vitamin C 1000 MG tablet     TAKE these medications   doxycycline 100 MG capsule Commonly known as: VIBRAMYCIN Take 1 capsule (100 mg total) by mouth 2 (two) times daily.   ibuprofen 600 MG tablet Commonly known as: ADVIL Take 1 tablet (600 mg total) by mouth every 6 (six) hours as needed.      Follow-up Information    Vickki Hearing, MD Follow up on 08/06/2019.   Specialties: Orthopedic Surgery, Radiology Contact information: [address] [place] [address] [phone]          Allergies  Allergen Reactions  . Sulfa Antibiotics Rash    Consultations:  Orthopedics   Procedures/Studies: MR HAND RIGHT WO CONTRAST  Result Date: 07/28/2019 CLINICAL DATA:  Right hand pain and swelling since falling 2 days ago. Worsening erythema and swelling. History of intravenous drug abuse. EXAM: MRI OF THE RIGHT HAND WITHOUT CONTRAST TECHNIQUE: Multiplanar, multisequence MR imaging of the right hand was performed. No intravenous contrast was administered. COMPARISON:  Radiographs 07/28/2019 FINDINGS: Despite efforts by the technologist and patient, mild motion artifact is present on today's exam and could not  be eliminated. This reduces exam sensitivity and specificity. The patient was unable to complete the study. No post-contrast imaging was obtained. Bones/Joint/Cartilage No evidence of acute fracture, dislocation or bone destruction. There is nonspecific low-level bone marrow edema within the scaphoid, best seen on the coronal inversion recovery images. No significant joint effusions or arthropathic changes.  Ligaments Ligamentous detail limited by motion and large field of view. No gross ligamentous abnormality or carpal bone diastasis. Muscles and Tendons There is prominent fluid within the extensor digitorum tendon sheaths at the level of the distal carpal row and metacarpal bases. No significant flexor tendon abnormalities identified. No focal fluid collection or significant edema of the hand musculature identified. Soft tissues Severe soft tissue swelling and subcutaneous edema throughout the hand, especially dorsally. No focal fluid collection or foreign body identified on noncontrast imaging. IMPRESSION: 1. Severe soft tissue swelling and subcutaneous edema throughout the hand, especially dorsally. No focal fluid collection or foreign body identified on noncontrast imaging. 2. Extensor digitorum tenosynovitis, suspicious for infectious tenosynovitis in this clinical context. Orthopedic consultation recommended. 3. No evidence of osteomyelitis or septic joint. Electronically Signed   By: Carey Bullocks M.D.   On: 07/28/2019 14:15   DG Hand Complete Right  Result Date: 07/28/2019 CLINICAL DATA:  39 year old male status post fall several days ago with continued right hand pain redness and swelling. EXAM: RIGHT HAND - COMPLETE 3+ VIEW COMPARISON:  None. FINDINGS: Bone mineralization is within normal limits. Moderate to severe generalized soft tissue swelling at the right hand and wrist, especially dorsal. No soft tissue gas identified. No radiopaque foreign body identified. No fracture or dislocation identified in the right hand with normal joint spaces and alignment. IMPRESSION: Moderate to severe right hand and wrist soft tissue swelling with no osseous abnormality identified. Electronically Signed   By: Odessa Fleming M.D.   On: 07/28/2019 10:58       Subjective: Feels the swelling and erythema is better.  Less pain in right hand.  Wants to go home.  Discharge Exam: Vitals:   07/31/19 2018 07/31/19 2234  08/01/19 0452 08/01/19 1413  BP:  133/71 117/62 127/82  Pulse:  (!) 57 (!) 59 69  Resp:  18 18 16   Temp:  (!) 97.5 F (36.4 C) (!) 97.4 F (36.3 C) 97.9 F (36.6 C)  TempSrc:  Oral Oral Oral  SpO2: 100% 100% 99% 100%  Weight:      Height:        General: Pt is alert, awake, not in acute distress Cardiovascular: RRR, S1/S2 +, no rubs, no gallops Respiratory: CTA bilaterally, no wheezing, no rhonchi Abdominal: Soft, NT, ND, bowel sounds + Extremities: Blister on right hand continues to drain small amount of serous fluid.  Overall swelling and erythema is better    The results of significant diagnostics from this hospitalization (including imaging, microbiology, ancillary and laboratory) are listed below for reference.     Microbiology: Recent Results (from the past 240 hour(s))  SARS CORONAVIRUS 2 (TAT 6-24 HRS) Nasopharyngeal Nasopharyngeal Swab     Status: None   Collection Time: 07/28/19 11:09 AM   Specimen: Nasopharyngeal Swab  Result Value Ref Range Status   SARS Coronavirus 2 NEGATIVE NEGATIVE Final    Comment: (NOTE) SARS-CoV-2 target nucleic acids are NOT DETECTED. The SARS-CoV-2 RNA is generally detectable in upper and lower respiratory specimens during the acute phase of infection. Negative results do not preclude SARS-CoV-2 infection, do not rule out co-infections with other pathogens, and should not  be used as the sole basis for treatment or other patient management decisions. Negative results must be combined with clinical observations, patient history, and epidemiological information. The expected result is Negative. Fact Sheet for Patients: HairSlick.no Fact Sheet for Healthcare Providers: quierodirigir.com This test is not yet approved or cleared by the [place] FDA and  has been authorized for detection and/or diagnosis of SARS-CoV-2 by FDA under an Emergency Use Authorization (EUA). This EUA will  remain  in effect (meaning this test can be used) for the duration of the COVID-19 declaration under Section 56 4(b)(1) of the Act, 21 U.S.C. section 360bbb-3(b)(1), unless the authorization is terminated or revoked sooner. Performed at [facility], [address], [place], [place] [address]   Blood culture (routine x 2)     Status: None (Preliminary result)   Collection Time: 07/28/19 11:51 AM   Specimen: BLOOD  Result Value Ref Range Status   Specimen Description BLOOD SITE NOT SPECIFIED DRAWN BY RN  Final   Special Requests   Final    BOTTLES DRAWN AEROBIC AND ANAEROBIC Blood Culture adequate volume   Culture   Final    NO GROWTH 3 DAYS Performed at [facility], [address], [place], [place] [address]    Report Status PENDING  Incomplete  Blood culture (routine x 2)     Status: None (Preliminary result)   Collection Time: 07/28/19 12:20 PM   Specimen: BLOOD RIGHT ARM  Result Value Ref Range Status   Specimen Description BLOOD RIGHT ARM  Final   Special Requests   Final    BOTTLES DRAWN AEROBIC AND ANAEROBIC Blood Culture results may not be optimal due to an excessive volume of blood received in culture bottles   Culture   Final    NO GROWTH 3 DAYS Performed at [facility], [address] [place], [place] [address]    Report Status PENDING  Incomplete     Labs: BNP (last 3 results) No results for input(s): BNP in the last 8760 hours. Basic Metabolic Panel: Recent Labs  Lab 07/28/19 1150 07/29/19 0704 07/30/19 0638  NA 135 138  --   K 4.0 3.8  --   CL 99 103  --   CO2 26 27  --   GLUCOSE 106* 96  --   BUN 9 11  --   CREATININE 0.79 0.83 0.80  CALCIUM 8.6* 8.5*  --    Liver Function Tests: No results for input(s): AST, ALT, ALKPHOS, BILITOT, PROT, ALBUMIN in the last 168 hours. No results for input(s): LIPASE, AMYLASE in the last 168 hours. No results for input(s): AMMONIA in the last 168 hours. CBC: Recent Labs  Lab 07/28/19 1150 07/29/19 0704   WBC 14.0* 10.1  NEUTROABS 10.1*  --   HGB 13.2 12.7*  HCT 40.0 39.6  MCV 87.5 88.2  PLT 345 330   Cardiac Enzymes: No results for input(s): CKTOTAL, CKMB, CKMBINDEX, TROPONINI in the last 168 hours. BNP: Invalid input(s): POCBNP CBG: No results for input(s): GLUCAP in the last 168 hours. D-Dimer No results for input(s): DDIMER in the last 72 hours. Hgb A1c No results for input(s): HGBA1C in the last 72 hours. Lipid Profile No results for input(s): CHOL, HDL, LDLCALC, TRIG, CHOLHDL, LDLDIRECT in the last 72 hours. Thyroid function studies No results for input(s): TSH, T4TOTAL, T3FREE, THYROIDAB in the last 72 hours.  Invalid input(s): FREET3 Anemia work up No results for input(s): VITAMINB12, FOLATE, FERRITIN, TIBC, IRON, RETICCTPCT in the  last 72 hours. Urinalysis    Component Value Date/Time   COLORURINE STRAW (A) 02/27/2019 0119   APPEARANCEUR CLEAR 02/27/2019 0119   LABSPEC 1.003 (L) 02/27/2019 0119   PHURINE 6.0 02/27/2019 0119   GLUCOSEU NEGATIVE 02/27/2019 0119   HGBUR NEGATIVE 02/27/2019 0119   BILIRUBINUR NEGATIVE 02/27/2019 0119   KETONESUR NEGATIVE 02/27/2019 0119   PROTEINUR NEGATIVE 02/27/2019 0119   NITRITE NEGATIVE 02/27/2019 0119   LEUKOCYTESUR NEGATIVE 02/27/2019 0119   Sepsis Labs Invalid input(s): PROCALCITONIN,  WBC,  LACTICIDVEN Microbiology Recent Results (from the past 240 hour(s))  SARS CORONAVIRUS 2 (TAT 6-24 HRS) Nasopharyngeal Nasopharyngeal Swab     Status: None   Collection Time: 07/28/19 11:09 AM   Specimen: Nasopharyngeal Swab  Result Value Ref Range Status   SARS Coronavirus 2 NEGATIVE NEGATIVE Final    Comment: (NOTE) SARS-CoV-2 target nucleic acids are NOT DETECTED. The SARS-CoV-2 RNA is generally detectable in upper and lower respiratory specimens during the acute phase of infection. Negative results do not preclude SARS-CoV-2 infection, do not rule out co-infections with other pathogens, and should not be used as the sole  basis for treatment or other patient management decisions. Negative results must be combined with clinical observations, patient history, and epidemiological information. The expected result is Negative. Fact Sheet for Patients: SugarRoll.be Fact Sheet for Healthcare Providers: https://www.woods-mathews.com/ This test is not yet approved or cleared by the [place] FDA and  has been authorized for detection and/or diagnosis of SARS-CoV-2 by FDA under an Emergency Use Authorization (EUA). This EUA will remain  in effect (meaning this test can be used) for the duration of the COVID-19 declaration under Section 56 4(b)(1) of the Act, 21 U.S.C. section 360bbb-3(b)(1), unless the authorization is terminated or revoked sooner. Performed at [facility], [place] [address] [place], [place] [address]   Blood culture (routine x 2)     Status: None (Preliminary result)   Collection Time: 07/28/19 11:51 AM   Specimen: BLOOD  Result Value Ref Range Status   Specimen Description BLOOD SITE NOT SPECIFIED DRAWN BY RN  Final   Special Requests   Final    BOTTLES DRAWN AEROBIC AND ANAEROBIC Blood Culture adequate volume   Culture   Final    NO GROWTH 3 DAYS Performed at [facility], [address] [place], [place] [address]    Report Status PENDING  Incomplete  Blood culture (routine x 2)     Status: None (Preliminary result)   Collection Time: 07/28/19 12:20 PM   Specimen: BLOOD RIGHT ARM  Result Value Ref Range Status   Specimen Description BLOOD RIGHT ARM  Final   Special Requests   Final    BOTTLES DRAWN AEROBIC AND ANAEROBIC Blood Culture results may not be optimal due to an excessive volume of blood received in culture bottles   Culture   Final    NO GROWTH 3 DAYS Performed at [facility], [address], [place], [place] [address]    Report Status PENDING  Incomplete     Time coordinating discharge: 53mins  SIGNED:   Kathie Dike, MD  [facility] 08/01/2019, 7:32 PM   If 7PM-7AM, please contact night-coverage [email or web address]

## 2019-08-01 NOTE — Progress Notes (Signed)
Patient ID: Gabriel Edwards, male   DOB: 06/05/1981, 39 y.o.   MRN: [id number]   BP 117/62 (BP Location: Left Arm)   Pulse (!) 59   Temp (!) 97.4 F (36.3 C) (Oral)   Resp 18   Ht 6\' 1"  (1.854 m)   Wt 93 kg   SpO2 99%   BMI 27.05 kg/m    Hand defervesced and blister opened up   Still has rom loss in extension and flexion and his erythema is much better  Recommend:  4 weeks of doxcycline   Soak warm water + salt + drop of dish detergent 15 min twice a day   Friday f/u

## 2019-08-01 NOTE — Progress Notes (Signed)
Pharmacy Antibiotic Note  Gabriel Edwards is a 39 y.o. male admitted on 07/28/2019 with cellulitis/abscess/hx of MRSA  Pharmacy has been consulted for Vanco/Zosyn dosing. Patient admits to intravenous drug use. H/O previous infections. MRI does not indicate any underlying abscess, but does comment on tenosynovitis. Infection is coalescing into abscess on dorsum of right hand. Will likely need I&D per MD.  Vancomycin Tr 86mcg/ml. Scr stable.  Plan: Change Vancomycin 1250 mg IV every 12 hours. Goal trough 10-15 mcg/mL. Zosyn 3.375g IV every 8 hours. Monitor labs, c/s, and vanco level as indicated.  Height: 6\' 1"  (185.4 cm) Weight: 205 lb (93 kg) IBW/kg (Calculated) : 79.9  Temp (24hrs), Avg:97.5 F (36.4 C), Min:97.4 F (36.3 C), Max:97.5 F (36.4 C)  Recent Labs  Lab 07/28/19 1150 07/29/19 0704 07/30/19 0638 08/01/19 0632  WBC 14.0* 10.1  --   --   CREATININE 0.79 0.83 0.80  --   VANCOTROUGH  --   --   --  19    Estimated Creatinine Clearance: 144.3 mL/min (by C-G formula based on SCr of 0.8 mg/dL).    Allergies  Allergen Reactions  . Sulfa Antibiotics Rash    Antimicrobials this admission: Vanco 1/6 >>  Zosyn 1/6 >>   Dose adjustments this admission: 1/10 change vancomycin 1250mg  IV q12h  Microbiology results: 1/6 BCx: ngtd  Thank you for allowing pharmacy to be a part of this patient's care.  09/29/19, BS Pharm D, Clinical Pharmacist Pager [phone] 08/01/2019 11:19 AM

## 2019-08-02 LAB — CULTURE, BLOOD (ROUTINE X 2)
Culture: NO GROWTH
Culture: NO GROWTH
Special Requests: ADEQUATE

## 2019-08-06 ENCOUNTER — Encounter: Payer: Self-pay | Admitting: Orthopedic Surgery

## 2019-08-06 ENCOUNTER — Other Ambulatory Visit: Payer: Self-pay

## 2019-08-06 ENCOUNTER — Ambulatory Visit (INDEPENDENT_AMBULATORY_CARE_PROVIDER_SITE_OTHER): Payer: Self-pay | Admitting: Orthopedic Surgery

## 2019-08-06 VITALS — BP 144/103 | HR 96 | Ht 73.0 in | Wt 231.0 lb

## 2019-08-06 DIAGNOSIS — L03113 Cellulitis of right upper limb: Secondary | ICD-10-CM

## 2019-08-06 NOTE — Progress Notes (Signed)
Chief Complaint  Patient presents with  . right hand    cellulitis f/u    Hospital consult follow-up cellulitis right hand secondary IV drug abuse  Patient is currently on doxycycline 100 mg twice a day  Past Medical History:  Diagnosis Date  . History of stomach ulcers   . MRSA (methicillin resistant staph aureus) culture positive   . MRSA (methicillin resistant Staphylococcus aureus)     Past Surgical History:  Procedure Laterality Date  . FRACTURE SURGERY    . LEG SURGERY     for MRSA  . STOMACH SURGERY      BP (!) 144/103   Pulse 96   Ht 6\' 1"  (1.854 m)   Wt 231 lb (104.8 kg)   BMI 30.48 kg/m     He complains of improved range of motion right hand decreased erythema  On examination he still has some fluctuance at the site of defervescent's some mild erythema on the back of the hand he has full range of motion of the hand  Recommend continue soaks continue his antibiotics follow-up in a week if he has not drained or decrease the fluctuant area he is agreed to let me lancet in the office  Encounter Diagnosis  Name Primary?  . Cellulitis of right hand Yes    Acute uncomplicated illness Prescription management continue doxycycline

## 2019-08-06 NOTE — Patient Instructions (Signed)
Continue soaking   Continue antibiotics

## 2019-08-16 ENCOUNTER — Encounter: Payer: Self-pay | Admitting: Orthopedic Surgery

## 2019-08-16 ENCOUNTER — Ambulatory Visit: Payer: Self-pay | Admitting: Orthopedic Surgery

## 2020-09-22 IMAGING — DX DG HAND COMPLETE 3+V*R*
3 series · 3 of 3 positions shown · non-contrast
Comparison: None.

CLINICAL DATA: 38-year-old male status post fall several days ago
with continued right hand pain redness and swelling.

EXAM:
RIGHT HAND - COMPLETE 3+ VIEW

[hand ap]
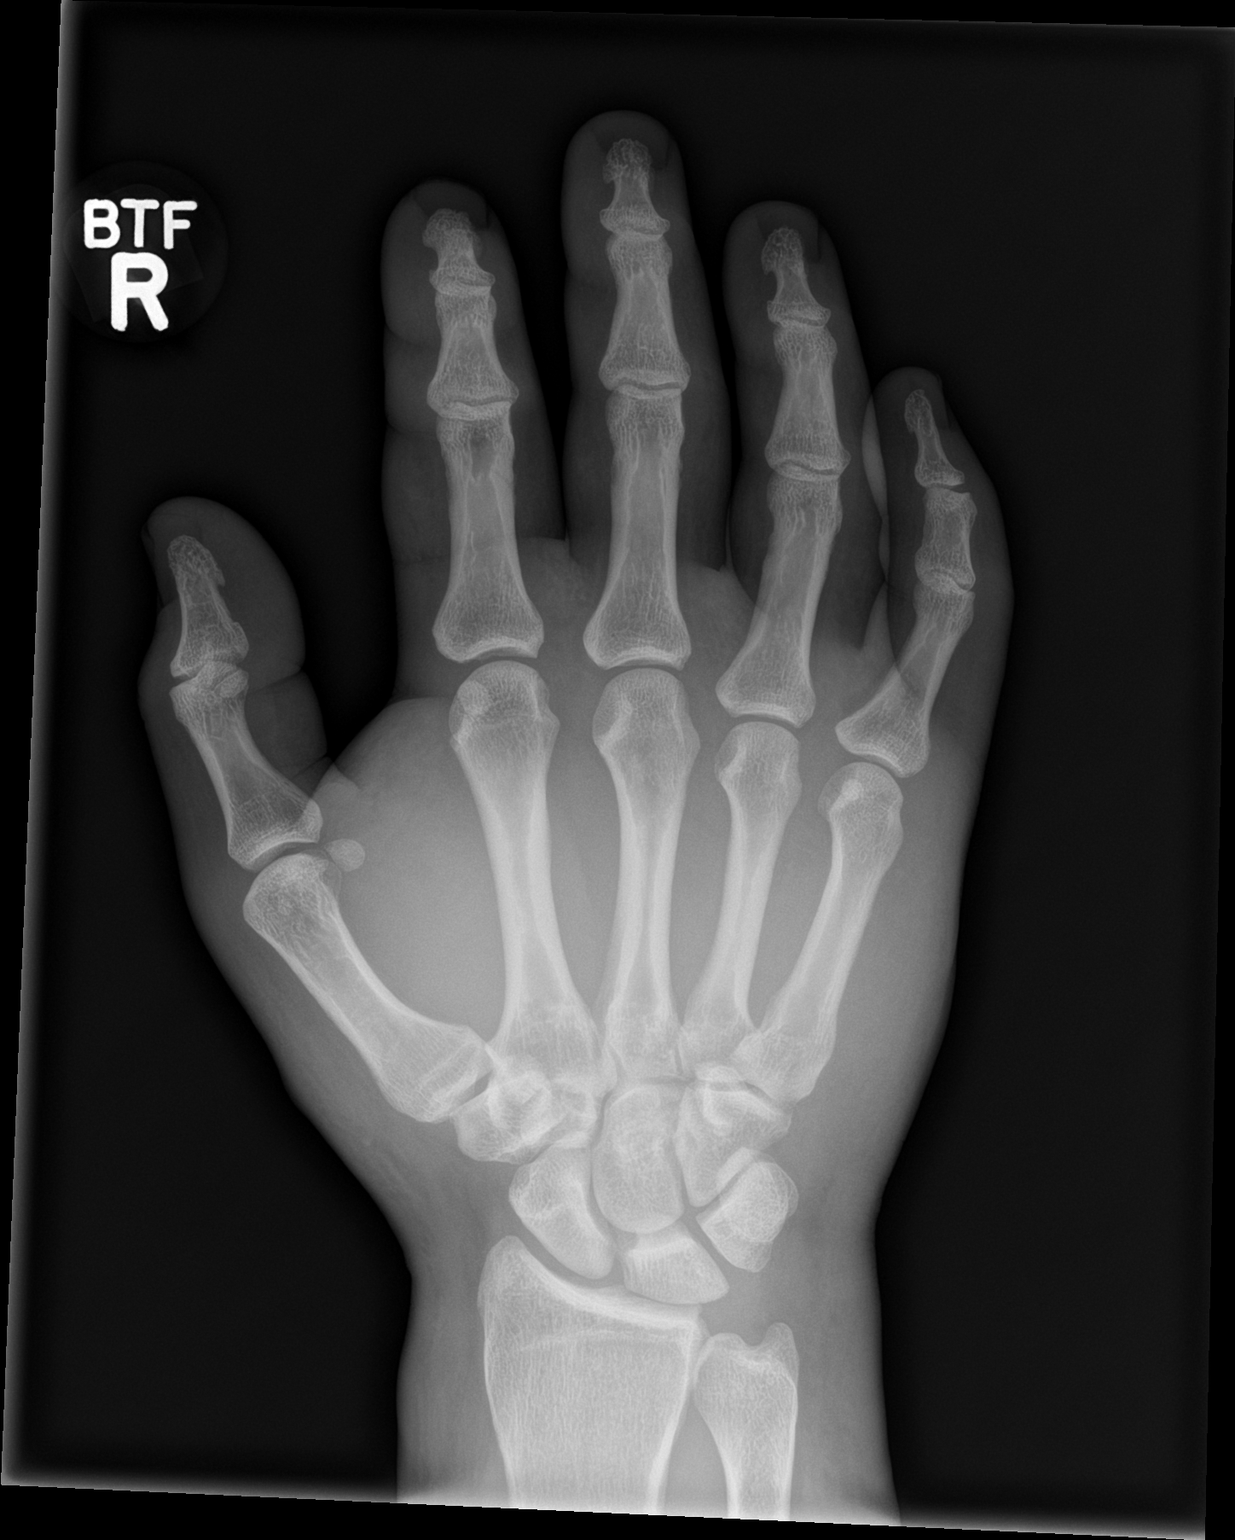

[hand obl]
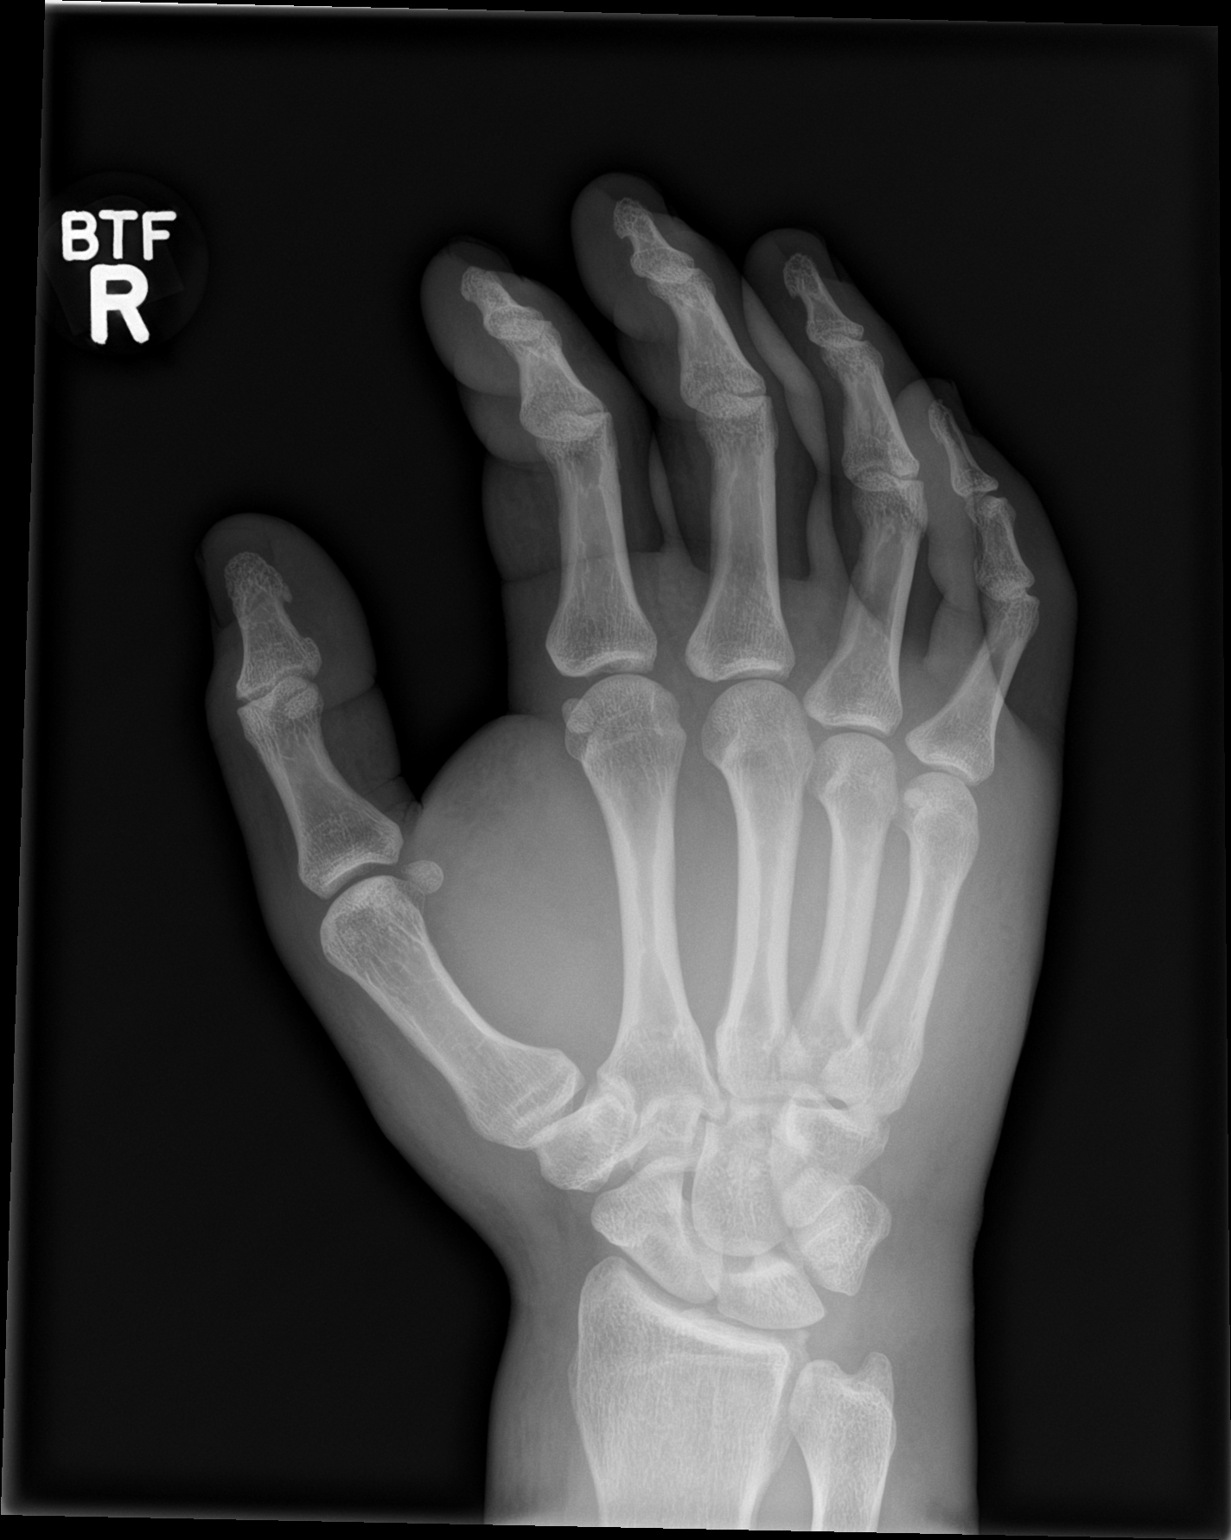

[hand lat]
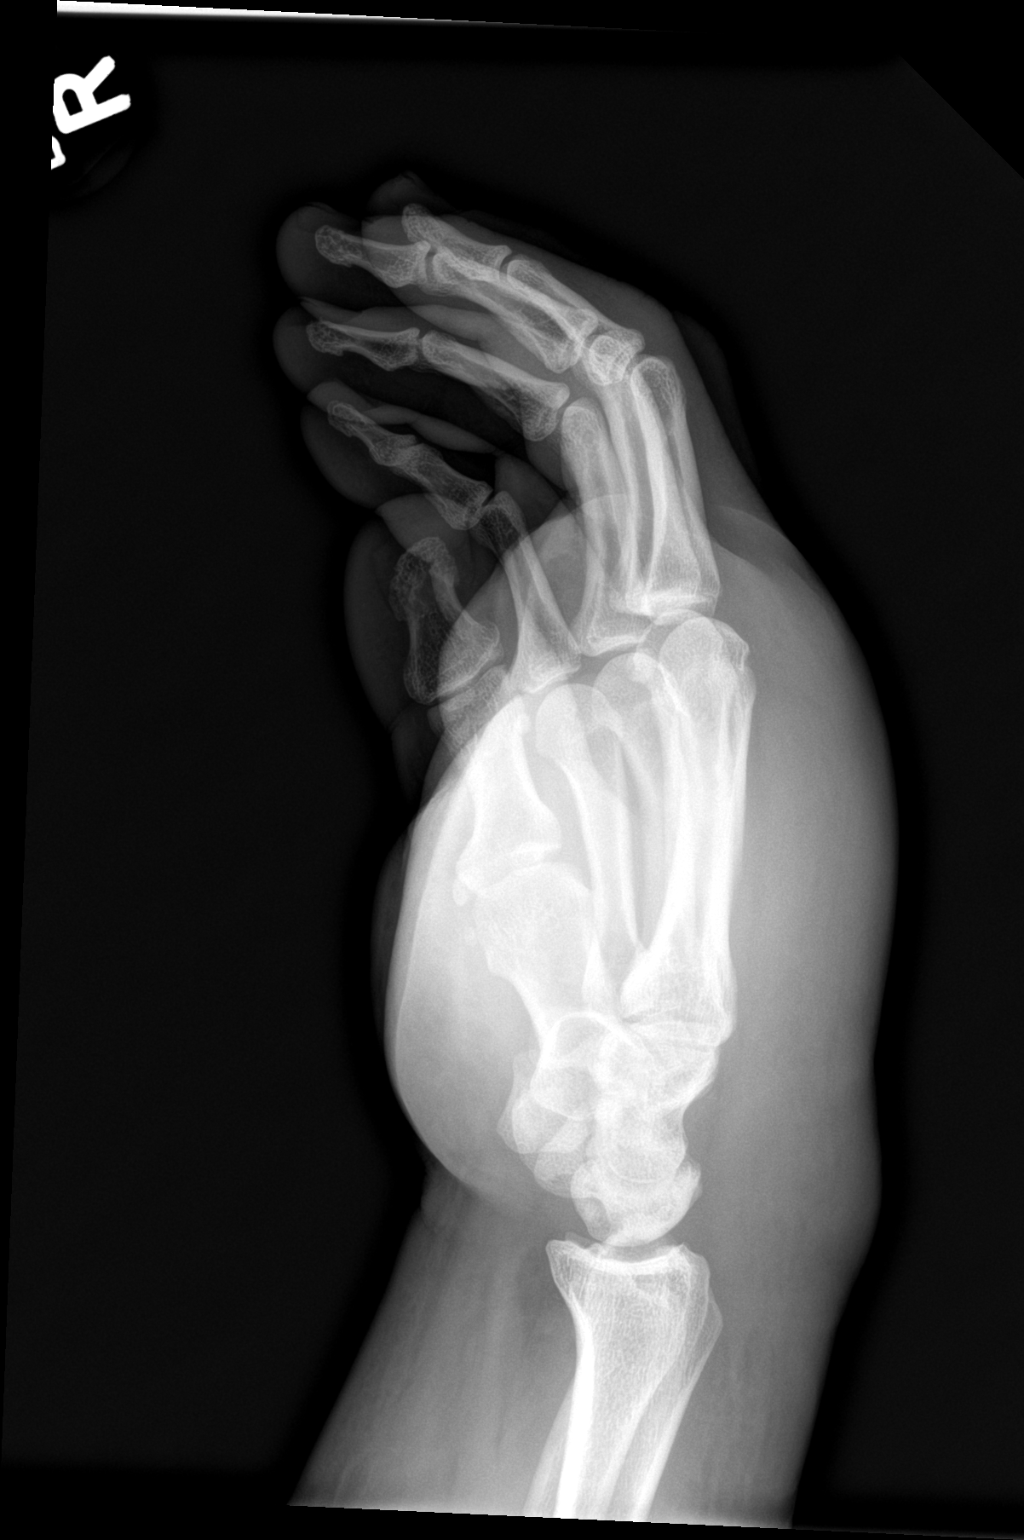

[3 of 3 positions shown; findings below may reference images not displayed]

FINDINGS: Bone mineralization is within normal limits. Moderate to severe
generalized soft tissue swelling at the right hand and wrist,
especially dorsal. No soft tissue gas identified. No radiopaque
foreign body identified.

No fracture or dislocation identified in the right hand with normal
joint spaces and alignment.
IMPRESSION: Moderate to severe right hand and wrist soft tissue swelling with no
osseous abnormality identified.

## 2021-02-03 ENCOUNTER — Other Ambulatory Visit: Payer: Self-pay

## 2021-02-03 ENCOUNTER — Encounter (HOSPITAL_COMMUNITY): Payer: Self-pay

## 2021-02-03 ENCOUNTER — Emergency Department (HOSPITAL_COMMUNITY)
Admission: EM | Admit: 2021-02-03 | Discharge: 2021-02-04 | Disposition: A | Discharge status: Home / Self Care | Payer: Self-pay | Attending: Emergency Medicine | Admitting: Emergency Medicine

## 2021-02-03 DIAGNOSIS — R609 Edema, unspecified: Secondary | ICD-10-CM | POA: Insufficient documentation

## 2021-02-03 DIAGNOSIS — R21 Rash and other nonspecific skin eruption: Secondary | ICD-10-CM | POA: Insufficient documentation

## 2021-02-03 DIAGNOSIS — L03116 Cellulitis of left lower limb: Secondary | ICD-10-CM | POA: Insufficient documentation

## 2021-02-03 DIAGNOSIS — L03115 Cellulitis of right lower limb: Secondary | ICD-10-CM | POA: Insufficient documentation

## 2021-02-03 DIAGNOSIS — F17210 Nicotine dependence, cigarettes, uncomplicated: Secondary | ICD-10-CM | POA: Insufficient documentation

## 2021-02-03 DIAGNOSIS — L03119 Cellulitis of unspecified part of limb: Secondary | ICD-10-CM

## 2021-02-03 MED ORDER — SODIUM CHLORIDE 0.9 % IV SOLN
2.0000 g | Freq: Once | INTRAVENOUS | Status: AC
  Administered 2021-02-04: 2 g via INTRAVENOUS
  Filled 2021-02-03: qty 20

## 2021-02-03 NOTE — ED Triage Notes (Signed)
Pt arrived via POV c/o bilateral lower extremity rash, redness, and swelling. Pt reports this rash has been worsening and spreading further up his legs.

## 2021-02-04 ENCOUNTER — Emergency Department (HOSPITAL_COMMUNITY): Payer: Self-pay

## 2021-02-04 ENCOUNTER — Other Ambulatory Visit: Payer: Self-pay

## 2021-02-04 LAB — COMPREHENSIVE METABOLIC PANEL WITH GFR
ALT: 12 U/L (ref 0–44)
AST: 17 U/L (ref 15–41)
Albumin: 3.3 g/dL — ABNORMAL LOW (ref 3.5–5.0)
Alkaline Phosphatase: 67 U/L (ref 38–126)
Anion gap: 7 (ref 5–15)
BUN: 10 mg/dL (ref 6–20)
CO2: 26 mmol/L (ref 22–32)
Calcium: 8.2 mg/dL — ABNORMAL LOW (ref 8.9–10.3)
Chloride: 100 mmol/L (ref 98–111)
Creatinine, Ser: 0.88 mg/dL (ref 0.61–1.24)
GFR, Estimated: >60 mL/min
Glucose, Bld: 137 mg/dL — ABNORMAL HIGH (ref 70–99)
Potassium: 3.9 mmol/L (ref 3.5–5.1)
Sodium: 133 mmol/L — ABNORMAL LOW (ref 135–145)
Total Bilirubin: 0.5 mg/dL (ref 0.3–1.2)
Total Protein: 7.3 g/dL (ref 6.5–8.1)

## 2021-02-04 LAB — CBC WITH DIFFERENTIAL/PLATELET
Abs Immature Granulocytes: 0.02 K/uL (ref 0.00–0.07)
Basophils Absolute: 0 K/uL (ref 0.0–0.1)
Basophils Relative: 0 %
Eosinophils Absolute: 0.1 K/uL (ref 0.0–0.5)
Eosinophils Relative: 1 %
HCT: 34.3 % — ABNORMAL LOW (ref 39.0–52.0)
Hemoglobin: 11.1 g/dL — ABNORMAL LOW (ref 13.0–17.0)
Immature Granulocytes: 0 %
Lymphocytes Relative: 13 %
Lymphs Abs: 0.9 K/uL (ref 0.7–4.0)
MCH: 27.7 pg (ref 26.0–34.0)
MCHC: 32.4 g/dL (ref 30.0–36.0)
MCV: 85.5 fL (ref 80.0–100.0)
Monocytes Absolute: 0.6 K/uL (ref 0.1–1.0)
Monocytes Relative: 9 %
Neutro Abs: 5.2 K/uL (ref 1.7–7.7)
Neutrophils Relative %: 77 %
Platelets: 301 K/uL (ref 150–400)
RBC: 4.01 MIL/uL — ABNORMAL LOW (ref 4.22–5.81)
RDW: 13.1 % (ref 11.5–15.5)
WBC: 6.8 K/uL (ref 4.0–10.5)
nRBC: 0 % (ref 0.0–0.2)

## 2021-02-04 LAB — PROTIME-INR
INR: 1.2 (ref 0.8–1.2)
Prothrombin Time: 14.8 s (ref 11.4–15.2)

## 2021-02-04 LAB — LACTIC ACID, PLASMA: Lactic Acid, Venous: 1.8 mmol/L (ref 0.5–1.9)

## 2021-02-04 LAB — APTT: aPTT: 31 s (ref 24–36)

## 2021-02-04 MED ORDER — ACETAMINOPHEN 500 MG PO TABS
1000.0000 mg | ORAL_TABLET | Freq: Once | ORAL | Status: AC
  Administered 2021-02-04: 1000 mg via ORAL
  Filled 2021-02-04: qty 2

## 2021-02-04 MED ORDER — CEPHALEXIN 500 MG PO CAPS: 500.0000 mg | ORAL_CAPSULE | Freq: Four times a day (QID) | ORAL | 0 refills | Status: AC

## 2021-02-04 MED ORDER — SODIUM CHLORIDE 0.9 % IV BOLUS
1000.0000 mL | Freq: Once | INTRAVENOUS | Status: AC
  Administered 2021-02-04: 1000 mL via INTRAVENOUS

## 2021-02-04 NOTE — ED Provider Notes (Signed)
[facility] EMERGENCY DEPARTMENT Provider Note   CSN: [id number] Arrival date & time: 02/03/21  2315     History Chief Complaint  Patient presents with   Cellulitis    Gabriel Edwards is a 40 y.o. male.  Multiple open wounds on BLE with a couple on both sides that have surrounding cellulitis. Pulses intact. Mild edema symmetrically. Has had a fever. Had had some pain. Clean and sober X4 months. H/o MRSA infections in past.        Past Medical History:  Diagnosis Date   History of stomach ulcers    MRSA (methicillin resistant staph aureus) culture positive    MRSA (methicillin resistant Staphylococcus aureus)     Patient Active Problem List   Diagnosis Date Noted   Cellulitis of right hand 07/28/2019   IVDU (intravenous drug user) 07/28/2019   Substance abuse (HCC) 07/28/2019    Past Surgical History:  Procedure Laterality Date   FRACTURE SURGERY     LEG SURGERY     for MRSA   STOMACH SURGERY         History reviewed. No pertinent family history.  Social History   Tobacco Use   Smoking status: Every Day    Packs/day: 1.00    Types: Cigarettes   Smokeless tobacco: Never  Vaping Use   Vaping Use: Never used  Substance Use Topics   Alcohol use: No   Drug use: No    Home Medications Prior to Admission medications   Medication Sig Start Date End Date Taking? Authorizing Provider  cephALEXin (KEFLEX) 500 MG capsule Take 1 capsule (500 mg total) by mouth 4 (four) times daily. 02/04/21  Yes Vian Fluegel, Barbara Cower, MD  doxycycline (VIBRAMYCIN) 100 MG capsule Take 1 capsule (100 mg total) by mouth 2 (two) times daily. 08/01/19   Erick Blinks, MD  ibuprofen (ADVIL) 600 MG tablet Take 1 tablet (600 mg total) by mouth every 6 (six) hours as needed. 08/01/19   Erick Blinks, MD    Allergies    Sulfa antibiotics  Review of Systems   Review of Systems  All other systems reviewed and are negative.  Physical Exam Updated Vital Signs BP 134/77   Pulse 77   Temp  98.2 F (36.8 C) (Oral)   Resp (!) 24   Ht 6\' 1"  (1.854 m)   Wt 93 kg   SpO2 99%   BMI 27.05 kg/m   Physical Exam Vitals and nursing note reviewed.  Constitutional:      Appearance: He is well-developed.  HENT:     Head: Normocephalic and atraumatic.     Mouth/Throat:     Mouth: Mucous membranes are moist.     Pharynx: Oropharynx is clear.  Eyes:     Pupils: Pupils are equal, round, and reactive to light.  Cardiovascular:     Rate and Rhythm: Normal rate.  Pulmonary:     Effort: Pulmonary effort is normal. No respiratory distress.  Abdominal:     General: There is no distension.  Musculoskeletal:        General: Normal range of motion.     Cervical back: Normal range of motion.  Skin:    General: Skin is warm and dry.     Findings: Rash present.     Comments: Multiple open wounds on BLE with a couple on both sides that have surrounding cellulitis. Pulses intact. Mild edema symmetrically.   Neurological:     General: No focal deficit present.     Mental  Status: He is alert.    ED Results / Procedures / Treatments   Labs (all labs ordered are listed, but only abnormal results are displayed) Labs Reviewed  COMPREHENSIVE METABOLIC PANEL - Abnormal; Notable for the following components:      Result Value   Sodium 133 (*)    Glucose, Bld 137 (*)    Calcium 8.2 (*)    Albumin 3.3 (*)    All other components within normal limits  CBC WITH DIFFERENTIAL/PLATELET - Abnormal; Notable for the following components:   RBC 4.01 (*)    Hemoglobin 11.1 (*)    HCT 34.3 (*)    All other components within normal limits  CULTURE, BLOOD (SINGLE)  LACTIC ACID, PLASMA  PROTIME-INR  APTT    EKG None  Radiology DG Chest Port 1 View  Result Date: 02/04/2021 CLINICAL DATA:  Sepsis EXAM: PORTABLE CHEST 1 VIEW COMPARISON:  None. FINDINGS: The heart size and mediastinal contours are within normal limits. Both lungs are clear. The visualized skeletal structures are unremarkable.  IMPRESSION: No active disease. Electronically Signed   By: Helyn Numbers MD   On: 02/04/2021 01:10    Procedures Procedures   Medications Ordered in ED Medications  cefTRIAXone (ROCEPHIN) 2 g in sodium chloride 0.9 % 100 mL IVPB (0 g Intravenous Stopped 02/04/21 0105)  sodium chloride 0.9 % bolus 1,000 mL (0 mLs Intravenous Stopped 02/04/21 0247)  acetaminophen (TYLENOL) tablet 1,000 mg (1,000 mg Oral Given 02/04/21 0130)    ED Course  I have reviewed the triage vital signs and the nursing notes.  Pertinent labs & imaging results that were available during my care of the patient were reviewed by me and considered in my medical decision making (see chart for details).    MDM Rules/Calculators/A&P                         Sepsis workup initiated but WBC ok, LA ok, fever improved, hr improved, feels better with treatments. Will likely discharge.  VS improved. Appears well. Not septic appearing, blood culture pendign. Will dc on keflex w/ pcp/ED follow up. Also instructions given on MRSA decolonization.   Final Clinical Impression(s) / ED Diagnoses Final diagnoses:  Cellulitis of lower extremity, unspecified laterality    Rx / DC Orders ED Discharge Orders          Ordered    cephALEXin (KEFLEX) 500 MG capsule  4 times daily        02/04/21 0332             Chrishelle Zito, Barbara Cower, MD 02/04/21 [phone]

## 2021-02-04 NOTE — Discharge Instructions (Addendum)
You have an area of infected skin called cellulitis.  I have started you on antibiotics.  These antibiotics do not work instantaneously.   - You should allow at least 48 hours after your first dose for the cellulitis to slow it's spreading.   - You should allow 48 hours after the first dose to see the cellulitis to start improving.   - These things may happen quicker than that but allow at least these limitations prior to returning to the emergency department or your primary doctor.   - The exceptions to this are if you become systemically ill such as having a fever, nausea, vomiting, malaise.  - The other exception is if you have a rapid spreading of the cellulitis or the redness and swelling and pain.  If you notice more than a couple inches of spreading over a couple hours you need to return to the emergency department immediately because this could be a life-threatening infection. If you notice red streaking from the site you also need to return for reevaluation.  - If not, follow up with your primary doctor as instructed for reevaluation.    Abscesses and cellulitis are often caused by bacteria that live on your skin naturally all the time.  One way to get rid of this is to put a cup of bleach in a bathtub of water soaking for 10 minutes a day for a week.  If you do this, realize the bleach will stain your clothes, towels, carpets, rugs and anything else with die.  It will also make your feet slippery when used about a tub to be careful not to fall.  Another thing that works very well is chlorhexidine washes.  You can buy chlorhexidine wipes at the store and cleanse your whole body with them once a day for 7 days along with putting mupirocin ointment in your nose and finding chlorhexidine mouthwash to rinse mouth with twice a day.

## 2021-02-09 LAB — CULTURE, BLOOD (SINGLE): Culture: NO GROWTH

## 2021-12-11 DIAGNOSIS — Z20822 Contact with and (suspected) exposure to covid-19: Secondary | ICD-10-CM | POA: Diagnosis not present

## 2021-12-11 DIAGNOSIS — M4644 Discitis, unspecified, thoracic region: Secondary | ICD-10-CM | POA: Diagnosis not present

## 2021-12-12 DIAGNOSIS — M4624 Osteomyelitis of vertebra, thoracic region: Secondary | ICD-10-CM | POA: Diagnosis not present

## 2021-12-12 DIAGNOSIS — K279 Peptic ulcer, site unspecified, unspecified as acute or chronic, without hemorrhage or perforation: Secondary | ICD-10-CM | POA: Diagnosis not present

## 2021-12-12 DIAGNOSIS — B9562 Methicillin resistant Staphylococcus aureus infection as the cause of diseases classified elsewhere: Secondary | ICD-10-CM | POA: Diagnosis not present

## 2021-12-12 DIAGNOSIS — Z79899 Other long term (current) drug therapy: Secondary | ICD-10-CM | POA: Diagnosis not present

## 2021-12-12 DIAGNOSIS — M4640 Discitis, unspecified, site unspecified: Secondary | ICD-10-CM | POA: Diagnosis not present

## 2021-12-12 DIAGNOSIS — M4644 Discitis, unspecified, thoracic region: Secondary | ICD-10-CM | POA: Diagnosis not present

## 2021-12-12 DIAGNOSIS — I82621 Acute embolism and thrombosis of deep veins of right upper extremity: Secondary | ICD-10-CM | POA: Diagnosis not present

## 2021-12-12 DIAGNOSIS — F1120 Opioid dependence, uncomplicated: Secondary | ICD-10-CM | POA: Diagnosis not present

## 2021-12-12 DIAGNOSIS — Z5309 Procedure and treatment not carried out because of other contraindication: Secondary | ICD-10-CM | POA: Diagnosis not present

## 2021-12-12 DIAGNOSIS — F17210 Nicotine dependence, cigarettes, uncomplicated: Secondary | ICD-10-CM | POA: Diagnosis not present

## 2021-12-12 DIAGNOSIS — M4634 Infection of intervertebral disc (pyogenic), thoracic region: Secondary | ICD-10-CM | POA: Diagnosis not present

## 2021-12-12 DIAGNOSIS — R252 Cramp and spasm: Secondary | ICD-10-CM | POA: Diagnosis not present

## 2021-12-12 DIAGNOSIS — Z8619 Personal history of other infectious and parasitic diseases: Secondary | ICD-10-CM | POA: Diagnosis not present

## 2021-12-12 DIAGNOSIS — R69 Illness, unspecified: Secondary | ICD-10-CM | POA: Diagnosis not present

## 2021-12-12 DIAGNOSIS — E876 Hypokalemia: Secondary | ICD-10-CM | POA: Diagnosis not present

## 2021-12-12 DIAGNOSIS — M546 Pain in thoracic spine: Secondary | ICD-10-CM | POA: Diagnosis not present

## 2021-12-12 DIAGNOSIS — F1510 Other stimulant abuse, uncomplicated: Secondary | ICD-10-CM | POA: Diagnosis not present

## 2021-12-12 DIAGNOSIS — M4656 Other infective spondylopathies, lumbar region: Secondary | ICD-10-CM | POA: Diagnosis not present

## 2021-12-12 DIAGNOSIS — M5459 Other low back pain: Secondary | ICD-10-CM | POA: Diagnosis not present

## 2021-12-12 DIAGNOSIS — M549 Dorsalgia, unspecified: Secondary | ICD-10-CM | POA: Diagnosis not present

## 2021-12-12 DIAGNOSIS — R7881 Bacteremia: Secondary | ICD-10-CM | POA: Diagnosis not present

## 2021-12-12 DIAGNOSIS — G4700 Insomnia, unspecified: Secondary | ICD-10-CM | POA: Diagnosis not present

## 2021-12-12 DIAGNOSIS — G4701 Insomnia due to medical condition: Secondary | ICD-10-CM | POA: Diagnosis not present

## 2021-12-12 DIAGNOSIS — Z881 Allergy status to other antibiotic agents status: Secondary | ICD-10-CM | POA: Diagnosis not present

## 2021-12-12 DIAGNOSIS — Z596 Low income: Secondary | ICD-10-CM | POA: Diagnosis not present

## 2021-12-12 DIAGNOSIS — I330 Acute and subacute infective endocarditis: Secondary | ICD-10-CM | POA: Diagnosis not present

## 2021-12-13 DIAGNOSIS — M4644 Discitis, unspecified, thoracic region: Secondary | ICD-10-CM | POA: Diagnosis not present

## 2021-12-13 DIAGNOSIS — G4701 Insomnia due to medical condition: Secondary | ICD-10-CM | POA: Diagnosis not present

## 2021-12-13 DIAGNOSIS — M50222 Other cervical disc displacement at C5-C6 level: Secondary | ICD-10-CM | POA: Diagnosis not present

## 2021-12-13 DIAGNOSIS — M47813 Spondylosis without myelopathy or radiculopathy, cervicothoracic region: Secondary | ICD-10-CM | POA: Diagnosis not present

## 2021-12-13 DIAGNOSIS — M47812 Spondylosis without myelopathy or radiculopathy, cervical region: Secondary | ICD-10-CM | POA: Diagnosis not present

## 2021-12-13 DIAGNOSIS — M4640 Discitis, unspecified, site unspecified: Secondary | ICD-10-CM | POA: Diagnosis not present

## 2021-12-13 DIAGNOSIS — M4802 Spinal stenosis, cervical region: Secondary | ICD-10-CM | POA: Diagnosis not present

## 2021-12-13 DIAGNOSIS — M4624 Osteomyelitis of vertebra, thoracic region: Secondary | ICD-10-CM | POA: Diagnosis not present

## 2021-12-13 DIAGNOSIS — M5134 Other intervertebral disc degeneration, thoracic region: Secondary | ICD-10-CM | POA: Diagnosis not present

## 2021-12-13 DIAGNOSIS — R69 Illness, unspecified: Secondary | ICD-10-CM | POA: Diagnosis not present

## 2021-12-14 DIAGNOSIS — R69 Illness, unspecified: Secondary | ICD-10-CM | POA: Diagnosis not present

## 2021-12-14 DIAGNOSIS — M4640 Discitis, unspecified, site unspecified: Secondary | ICD-10-CM | POA: Diagnosis not present

## 2021-12-14 DIAGNOSIS — M47816 Spondylosis without myelopathy or radiculopathy, lumbar region: Secondary | ICD-10-CM | POA: Diagnosis not present

## 2021-12-14 DIAGNOSIS — R600 Localized edema: Secondary | ICD-10-CM | POA: Diagnosis not present

## 2021-12-14 DIAGNOSIS — B9562 Methicillin resistant Staphylococcus aureus infection as the cause of diseases classified elsewhere: Secondary | ICD-10-CM | POA: Diagnosis not present

## 2021-12-14 DIAGNOSIS — I340 Nonrheumatic mitral (valve) insufficiency: Secondary | ICD-10-CM | POA: Diagnosis not present

## 2021-12-14 DIAGNOSIS — G4701 Insomnia due to medical condition: Secondary | ICD-10-CM | POA: Diagnosis not present

## 2021-12-14 DIAGNOSIS — A4102 Sepsis due to Methicillin resistant Staphylococcus aureus: Secondary | ICD-10-CM | POA: Diagnosis not present

## 2021-12-14 DIAGNOSIS — R7881 Bacteremia: Secondary | ICD-10-CM | POA: Diagnosis not present

## 2021-12-14 DIAGNOSIS — M4644 Discitis, unspecified, thoracic region: Secondary | ICD-10-CM | POA: Diagnosis not present

## 2021-12-14 DIAGNOSIS — M4624 Osteomyelitis of vertebra, thoracic region: Secondary | ICD-10-CM | POA: Diagnosis not present

## 2021-12-15 DIAGNOSIS — B9562 Methicillin resistant Staphylococcus aureus infection as the cause of diseases classified elsewhere: Secondary | ICD-10-CM | POA: Diagnosis not present

## 2021-12-15 DIAGNOSIS — M4640 Discitis, unspecified, site unspecified: Secondary | ICD-10-CM | POA: Diagnosis not present

## 2021-12-15 DIAGNOSIS — G4701 Insomnia due to medical condition: Secondary | ICD-10-CM | POA: Diagnosis not present

## 2021-12-15 DIAGNOSIS — R69 Illness, unspecified: Secondary | ICD-10-CM | POA: Diagnosis not present

## 2021-12-15 DIAGNOSIS — R7881 Bacteremia: Secondary | ICD-10-CM | POA: Diagnosis not present

## 2021-12-15 DIAGNOSIS — M4624 Osteomyelitis of vertebra, thoracic region: Secondary | ICD-10-CM | POA: Diagnosis not present

## 2021-12-15 DIAGNOSIS — A4102 Sepsis due to Methicillin resistant Staphylococcus aureus: Secondary | ICD-10-CM | POA: Diagnosis not present

## 2021-12-15 DIAGNOSIS — M4644 Discitis, unspecified, thoracic region: Secondary | ICD-10-CM | POA: Diagnosis not present

## 2021-12-16 DIAGNOSIS — M4644 Discitis, unspecified, thoracic region: Secondary | ICD-10-CM | POA: Diagnosis not present

## 2021-12-16 DIAGNOSIS — A4102 Sepsis due to Methicillin resistant Staphylococcus aureus: Secondary | ICD-10-CM | POA: Diagnosis not present

## 2021-12-16 DIAGNOSIS — M4624 Osteomyelitis of vertebra, thoracic region: Secondary | ICD-10-CM | POA: Diagnosis not present

## 2021-12-16 DIAGNOSIS — B9562 Methicillin resistant Staphylococcus aureus infection as the cause of diseases classified elsewhere: Secondary | ICD-10-CM | POA: Diagnosis not present

## 2021-12-16 DIAGNOSIS — R69 Illness, unspecified: Secondary | ICD-10-CM | POA: Diagnosis not present

## 2021-12-16 DIAGNOSIS — M4640 Discitis, unspecified, site unspecified: Secondary | ICD-10-CM | POA: Diagnosis not present

## 2021-12-16 DIAGNOSIS — G4701 Insomnia due to medical condition: Secondary | ICD-10-CM | POA: Diagnosis not present

## 2021-12-16 DIAGNOSIS — R7881 Bacteremia: Secondary | ICD-10-CM | POA: Diagnosis not present

## 2021-12-17 DIAGNOSIS — G4701 Insomnia due to medical condition: Secondary | ICD-10-CM | POA: Diagnosis not present

## 2021-12-17 DIAGNOSIS — B9562 Methicillin resistant Staphylococcus aureus infection as the cause of diseases classified elsewhere: Secondary | ICD-10-CM | POA: Diagnosis not present

## 2021-12-17 DIAGNOSIS — R69 Illness, unspecified: Secondary | ICD-10-CM | POA: Diagnosis not present

## 2021-12-17 DIAGNOSIS — M4644 Discitis, unspecified, thoracic region: Secondary | ICD-10-CM | POA: Diagnosis not present

## 2021-12-17 DIAGNOSIS — M4640 Discitis, unspecified, site unspecified: Secondary | ICD-10-CM | POA: Diagnosis not present

## 2021-12-17 DIAGNOSIS — R7881 Bacteremia: Secondary | ICD-10-CM | POA: Diagnosis not present

## 2021-12-17 DIAGNOSIS — M4624 Osteomyelitis of vertebra, thoracic region: Secondary | ICD-10-CM | POA: Diagnosis not present

## 2021-12-18 DIAGNOSIS — M4644 Discitis, unspecified, thoracic region: Secondary | ICD-10-CM | POA: Diagnosis not present

## 2021-12-18 DIAGNOSIS — I059 Rheumatic mitral valve disease, unspecified: Secondary | ICD-10-CM | POA: Diagnosis not present

## 2021-12-18 DIAGNOSIS — M4624 Osteomyelitis of vertebra, thoracic region: Secondary | ICD-10-CM | POA: Diagnosis not present

## 2021-12-18 DIAGNOSIS — Z5181 Encounter for therapeutic drug level monitoring: Secondary | ICD-10-CM | POA: Diagnosis not present

## 2021-12-18 DIAGNOSIS — I340 Nonrheumatic mitral (valve) insufficiency: Secondary | ICD-10-CM | POA: Diagnosis not present

## 2021-12-18 DIAGNOSIS — R7881 Bacteremia: Secondary | ICD-10-CM | POA: Diagnosis not present

## 2021-12-18 DIAGNOSIS — M4640 Discitis, unspecified, site unspecified: Secondary | ICD-10-CM | POA: Diagnosis not present

## 2021-12-18 DIAGNOSIS — A4902 Methicillin resistant Staphylococcus aureus infection, unspecified site: Secondary | ICD-10-CM | POA: Diagnosis not present

## 2021-12-18 DIAGNOSIS — B9562 Methicillin resistant Staphylococcus aureus infection as the cause of diseases classified elsewhere: Secondary | ICD-10-CM | POA: Diagnosis not present

## 2021-12-18 DIAGNOSIS — R931 Abnormal findings on diagnostic imaging of heart and coronary circulation: Secondary | ICD-10-CM | POA: Diagnosis not present

## 2021-12-18 DIAGNOSIS — G4701 Insomnia due to medical condition: Secondary | ICD-10-CM | POA: Diagnosis not present

## 2021-12-18 DIAGNOSIS — R69 Illness, unspecified: Secondary | ICD-10-CM | POA: Diagnosis not present

## 2021-12-19 DIAGNOSIS — A4902 Methicillin resistant Staphylococcus aureus infection, unspecified site: Secondary | ICD-10-CM | POA: Diagnosis not present

## 2021-12-19 DIAGNOSIS — M4640 Discitis, unspecified, site unspecified: Secondary | ICD-10-CM | POA: Diagnosis not present

## 2021-12-19 DIAGNOSIS — R7881 Bacteremia: Secondary | ICD-10-CM | POA: Diagnosis not present

## 2021-12-19 DIAGNOSIS — R69 Illness, unspecified: Secondary | ICD-10-CM | POA: Diagnosis not present

## 2021-12-19 DIAGNOSIS — G4701 Insomnia due to medical condition: Secondary | ICD-10-CM | POA: Diagnosis not present

## 2021-12-19 DIAGNOSIS — Z5181 Encounter for therapeutic drug level monitoring: Secondary | ICD-10-CM | POA: Diagnosis not present

## 2021-12-19 DIAGNOSIS — M4644 Discitis, unspecified, thoracic region: Secondary | ICD-10-CM | POA: Diagnosis not present

## 2021-12-19 DIAGNOSIS — M4624 Osteomyelitis of vertebra, thoracic region: Secondary | ICD-10-CM | POA: Diagnosis not present

## 2021-12-19 DIAGNOSIS — B9562 Methicillin resistant Staphylococcus aureus infection as the cause of diseases classified elsewhere: Secondary | ICD-10-CM | POA: Diagnosis not present

## 2021-12-20 DIAGNOSIS — M4644 Discitis, unspecified, thoracic region: Secondary | ICD-10-CM | POA: Diagnosis not present

## 2021-12-20 DIAGNOSIS — M4640 Discitis, unspecified, site unspecified: Secondary | ICD-10-CM | POA: Diagnosis not present

## 2021-12-20 DIAGNOSIS — B9562 Methicillin resistant Staphylococcus aureus infection as the cause of diseases classified elsewhere: Secondary | ICD-10-CM | POA: Diagnosis not present

## 2021-12-20 DIAGNOSIS — Z5181 Encounter for therapeutic drug level monitoring: Secondary | ICD-10-CM | POA: Diagnosis not present

## 2021-12-20 DIAGNOSIS — M4624 Osteomyelitis of vertebra, thoracic region: Secondary | ICD-10-CM | POA: Diagnosis not present

## 2021-12-20 DIAGNOSIS — A4902 Methicillin resistant Staphylococcus aureus infection, unspecified site: Secondary | ICD-10-CM | POA: Diagnosis not present

## 2021-12-20 DIAGNOSIS — G4701 Insomnia due to medical condition: Secondary | ICD-10-CM | POA: Diagnosis not present

## 2021-12-20 DIAGNOSIS — R7881 Bacteremia: Secondary | ICD-10-CM | POA: Diagnosis not present

## 2021-12-20 DIAGNOSIS — R69 Illness, unspecified: Secondary | ICD-10-CM | POA: Diagnosis not present

## 2021-12-21 DIAGNOSIS — R69 Illness, unspecified: Secondary | ICD-10-CM | POA: Diagnosis not present

## 2021-12-21 DIAGNOSIS — M4644 Discitis, unspecified, thoracic region: Secondary | ICD-10-CM | POA: Diagnosis not present

## 2021-12-21 DIAGNOSIS — A4902 Methicillin resistant Staphylococcus aureus infection, unspecified site: Secondary | ICD-10-CM | POA: Diagnosis not present

## 2021-12-21 DIAGNOSIS — B9562 Methicillin resistant Staphylococcus aureus infection as the cause of diseases classified elsewhere: Secondary | ICD-10-CM | POA: Diagnosis not present

## 2021-12-21 DIAGNOSIS — M4640 Discitis, unspecified, site unspecified: Secondary | ICD-10-CM | POA: Diagnosis not present

## 2021-12-21 DIAGNOSIS — M4624 Osteomyelitis of vertebra, thoracic region: Secondary | ICD-10-CM | POA: Diagnosis not present

## 2021-12-21 DIAGNOSIS — Z5181 Encounter for therapeutic drug level monitoring: Secondary | ICD-10-CM | POA: Diagnosis not present

## 2021-12-21 DIAGNOSIS — R7881 Bacteremia: Secondary | ICD-10-CM | POA: Diagnosis not present

## 2021-12-21 DIAGNOSIS — G4701 Insomnia due to medical condition: Secondary | ICD-10-CM | POA: Diagnosis not present

## 2021-12-22 DIAGNOSIS — A4902 Methicillin resistant Staphylococcus aureus infection, unspecified site: Secondary | ICD-10-CM | POA: Diagnosis not present

## 2021-12-22 DIAGNOSIS — R69 Illness, unspecified: Secondary | ICD-10-CM | POA: Diagnosis not present

## 2021-12-22 DIAGNOSIS — Z5181 Encounter for therapeutic drug level monitoring: Secondary | ICD-10-CM | POA: Diagnosis not present

## 2021-12-22 DIAGNOSIS — M4640 Discitis, unspecified, site unspecified: Secondary | ICD-10-CM | POA: Diagnosis not present

## 2021-12-23 DIAGNOSIS — M4640 Discitis, unspecified, site unspecified: Secondary | ICD-10-CM | POA: Diagnosis not present

## 2021-12-23 DIAGNOSIS — R69 Illness, unspecified: Secondary | ICD-10-CM | POA: Diagnosis not present

## 2021-12-24 DIAGNOSIS — M4646 Discitis, unspecified, lumbar region: Secondary | ICD-10-CM | POA: Diagnosis not present

## 2021-12-24 DIAGNOSIS — A4102 Sepsis due to Methicillin resistant Staphylococcus aureus: Secondary | ICD-10-CM | POA: Diagnosis not present

## 2021-12-24 DIAGNOSIS — M4640 Discitis, unspecified, site unspecified: Secondary | ICD-10-CM | POA: Diagnosis not present

## 2021-12-24 DIAGNOSIS — R69 Illness, unspecified: Secondary | ICD-10-CM | POA: Diagnosis not present

## 2021-12-25 DIAGNOSIS — G4701 Insomnia due to medical condition: Secondary | ICD-10-CM | POA: Diagnosis not present

## 2021-12-25 DIAGNOSIS — R69 Illness, unspecified: Secondary | ICD-10-CM | POA: Diagnosis not present

## 2021-12-25 DIAGNOSIS — M4644 Discitis, unspecified, thoracic region: Secondary | ICD-10-CM | POA: Diagnosis not present

## 2021-12-25 DIAGNOSIS — M4640 Discitis, unspecified, site unspecified: Secondary | ICD-10-CM | POA: Diagnosis not present

## 2021-12-25 DIAGNOSIS — R7881 Bacteremia: Secondary | ICD-10-CM | POA: Diagnosis not present

## 2021-12-25 DIAGNOSIS — M4646 Discitis, unspecified, lumbar region: Secondary | ICD-10-CM | POA: Diagnosis not present

## 2021-12-25 DIAGNOSIS — B9562 Methicillin resistant Staphylococcus aureus infection as the cause of diseases classified elsewhere: Secondary | ICD-10-CM | POA: Diagnosis not present

## 2021-12-25 DIAGNOSIS — A4102 Sepsis due to Methicillin resistant Staphylococcus aureus: Secondary | ICD-10-CM | POA: Diagnosis not present

## 2021-12-25 DIAGNOSIS — M4624 Osteomyelitis of vertebra, thoracic region: Secondary | ICD-10-CM | POA: Diagnosis not present

## 2021-12-26 DIAGNOSIS — M4646 Discitis, unspecified, lumbar region: Secondary | ICD-10-CM | POA: Diagnosis not present

## 2021-12-26 DIAGNOSIS — B9562 Methicillin resistant Staphylococcus aureus infection as the cause of diseases classified elsewhere: Secondary | ICD-10-CM | POA: Diagnosis not present

## 2021-12-26 DIAGNOSIS — G4701 Insomnia due to medical condition: Secondary | ICD-10-CM | POA: Diagnosis not present

## 2021-12-26 DIAGNOSIS — M4640 Discitis, unspecified, site unspecified: Secondary | ICD-10-CM | POA: Diagnosis not present

## 2021-12-26 DIAGNOSIS — M4644 Discitis, unspecified, thoracic region: Secondary | ICD-10-CM | POA: Diagnosis not present

## 2021-12-26 DIAGNOSIS — A4102 Sepsis due to Methicillin resistant Staphylococcus aureus: Secondary | ICD-10-CM | POA: Diagnosis not present

## 2021-12-26 DIAGNOSIS — M4624 Osteomyelitis of vertebra, thoracic region: Secondary | ICD-10-CM | POA: Diagnosis not present

## 2021-12-26 DIAGNOSIS — R7881 Bacteremia: Secondary | ICD-10-CM | POA: Diagnosis not present

## 2021-12-26 DIAGNOSIS — I82621 Acute embolism and thrombosis of deep veins of right upper extremity: Secondary | ICD-10-CM | POA: Diagnosis not present

## 2021-12-26 DIAGNOSIS — R69 Illness, unspecified: Secondary | ICD-10-CM | POA: Diagnosis not present

## 2021-12-27 DIAGNOSIS — R69 Illness, unspecified: Secondary | ICD-10-CM | POA: Diagnosis not present

## 2021-12-27 DIAGNOSIS — A4102 Sepsis due to Methicillin resistant Staphylococcus aureus: Secondary | ICD-10-CM | POA: Diagnosis not present

## 2021-12-27 DIAGNOSIS — M4644 Discitis, unspecified, thoracic region: Secondary | ICD-10-CM | POA: Diagnosis not present

## 2021-12-27 DIAGNOSIS — G4701 Insomnia due to medical condition: Secondary | ICD-10-CM | POA: Diagnosis not present

## 2021-12-27 DIAGNOSIS — I82621 Acute embolism and thrombosis of deep veins of right upper extremity: Secondary | ICD-10-CM | POA: Diagnosis not present

## 2021-12-27 DIAGNOSIS — R7881 Bacteremia: Secondary | ICD-10-CM | POA: Diagnosis not present

## 2021-12-27 DIAGNOSIS — M4640 Discitis, unspecified, site unspecified: Secondary | ICD-10-CM | POA: Diagnosis not present

## 2021-12-27 DIAGNOSIS — M4646 Discitis, unspecified, lumbar region: Secondary | ICD-10-CM | POA: Diagnosis not present

## 2021-12-27 DIAGNOSIS — B9562 Methicillin resistant Staphylococcus aureus infection as the cause of diseases classified elsewhere: Secondary | ICD-10-CM | POA: Diagnosis not present

## 2021-12-27 DIAGNOSIS — M4624 Osteomyelitis of vertebra, thoracic region: Secondary | ICD-10-CM | POA: Diagnosis not present

## 2021-12-28 DIAGNOSIS — B9562 Methicillin resistant Staphylococcus aureus infection as the cause of diseases classified elsewhere: Secondary | ICD-10-CM | POA: Diagnosis not present

## 2021-12-28 DIAGNOSIS — R69 Illness, unspecified: Secondary | ICD-10-CM | POA: Diagnosis not present

## 2021-12-28 DIAGNOSIS — A4102 Sepsis due to Methicillin resistant Staphylococcus aureus: Secondary | ICD-10-CM | POA: Diagnosis not present

## 2021-12-28 DIAGNOSIS — R7881 Bacteremia: Secondary | ICD-10-CM | POA: Diagnosis not present

## 2021-12-28 DIAGNOSIS — G4701 Insomnia due to medical condition: Secondary | ICD-10-CM | POA: Diagnosis not present

## 2021-12-28 DIAGNOSIS — M4640 Discitis, unspecified, site unspecified: Secondary | ICD-10-CM | POA: Diagnosis not present

## 2021-12-28 DIAGNOSIS — M4624 Osteomyelitis of vertebra, thoracic region: Secondary | ICD-10-CM | POA: Diagnosis not present

## 2021-12-28 DIAGNOSIS — M4644 Discitis, unspecified, thoracic region: Secondary | ICD-10-CM | POA: Diagnosis not present

## 2021-12-28 DIAGNOSIS — M4646 Discitis, unspecified, lumbar region: Secondary | ICD-10-CM | POA: Diagnosis not present

## 2021-12-28 DIAGNOSIS — I82621 Acute embolism and thrombosis of deep veins of right upper extremity: Secondary | ICD-10-CM | POA: Diagnosis not present

## 2021-12-29 DIAGNOSIS — R7881 Bacteremia: Secondary | ICD-10-CM | POA: Diagnosis not present

## 2021-12-29 DIAGNOSIS — R69 Illness, unspecified: Secondary | ICD-10-CM | POA: Diagnosis not present

## 2021-12-29 DIAGNOSIS — G4701 Insomnia due to medical condition: Secondary | ICD-10-CM | POA: Diagnosis not present

## 2021-12-29 DIAGNOSIS — M4644 Discitis, unspecified, thoracic region: Secondary | ICD-10-CM | POA: Diagnosis not present

## 2021-12-29 DIAGNOSIS — I82621 Acute embolism and thrombosis of deep veins of right upper extremity: Secondary | ICD-10-CM | POA: Diagnosis not present

## 2021-12-29 DIAGNOSIS — B9562 Methicillin resistant Staphylococcus aureus infection as the cause of diseases classified elsewhere: Secondary | ICD-10-CM | POA: Diagnosis not present

## 2021-12-29 DIAGNOSIS — M4624 Osteomyelitis of vertebra, thoracic region: Secondary | ICD-10-CM | POA: Diagnosis not present

## 2021-12-29 DIAGNOSIS — M4640 Discitis, unspecified, site unspecified: Secondary | ICD-10-CM | POA: Diagnosis not present

## 2021-12-30 DIAGNOSIS — I82621 Acute embolism and thrombosis of deep veins of right upper extremity: Secondary | ICD-10-CM | POA: Diagnosis not present

## 2021-12-30 DIAGNOSIS — R7881 Bacteremia: Secondary | ICD-10-CM | POA: Diagnosis not present

## 2021-12-30 DIAGNOSIS — B9562 Methicillin resistant Staphylococcus aureus infection as the cause of diseases classified elsewhere: Secondary | ICD-10-CM | POA: Diagnosis not present

## 2021-12-30 DIAGNOSIS — M4640 Discitis, unspecified, site unspecified: Secondary | ICD-10-CM | POA: Diagnosis not present

## 2021-12-30 DIAGNOSIS — M4644 Discitis, unspecified, thoracic region: Secondary | ICD-10-CM | POA: Diagnosis not present

## 2021-12-30 DIAGNOSIS — G4701 Insomnia due to medical condition: Secondary | ICD-10-CM | POA: Diagnosis not present

## 2021-12-30 DIAGNOSIS — M4624 Osteomyelitis of vertebra, thoracic region: Secondary | ICD-10-CM | POA: Diagnosis not present

## 2021-12-30 DIAGNOSIS — R69 Illness, unspecified: Secondary | ICD-10-CM | POA: Diagnosis not present

## 2021-12-31 DIAGNOSIS — F1910 Other psychoactive substance abuse, uncomplicated: Secondary | ICD-10-CM | POA: Diagnosis not present

## 2021-12-31 DIAGNOSIS — M4640 Discitis, unspecified, site unspecified: Secondary | ICD-10-CM | POA: Diagnosis not present

## 2021-12-31 DIAGNOSIS — M4646 Discitis, unspecified, lumbar region: Secondary | ICD-10-CM | POA: Diagnosis not present

## 2021-12-31 DIAGNOSIS — I82621 Acute embolism and thrombosis of deep veins of right upper extremity: Secondary | ICD-10-CM | POA: Diagnosis not present

## 2021-12-31 DIAGNOSIS — G4701 Insomnia due to medical condition: Secondary | ICD-10-CM | POA: Diagnosis not present

## 2021-12-31 DIAGNOSIS — R7881 Bacteremia: Secondary | ICD-10-CM | POA: Diagnosis not present

## 2021-12-31 DIAGNOSIS — I330 Acute and subacute infective endocarditis: Secondary | ICD-10-CM | POA: Diagnosis not present

## 2021-12-31 DIAGNOSIS — M4624 Osteomyelitis of vertebra, thoracic region: Secondary | ICD-10-CM | POA: Diagnosis not present

## 2021-12-31 DIAGNOSIS — B9562 Methicillin resistant Staphylococcus aureus infection as the cause of diseases classified elsewhere: Secondary | ICD-10-CM | POA: Diagnosis not present

## 2021-12-31 DIAGNOSIS — R69 Illness, unspecified: Secondary | ICD-10-CM | POA: Diagnosis not present

## 2021-12-31 DIAGNOSIS — M4644 Discitis, unspecified, thoracic region: Secondary | ICD-10-CM | POA: Diagnosis not present

## 2022-01-01 DIAGNOSIS — G4701 Insomnia due to medical condition: Secondary | ICD-10-CM | POA: Diagnosis not present

## 2022-01-01 DIAGNOSIS — M4624 Osteomyelitis of vertebra, thoracic region: Secondary | ICD-10-CM | POA: Diagnosis not present

## 2022-01-01 DIAGNOSIS — M4644 Discitis, unspecified, thoracic region: Secondary | ICD-10-CM | POA: Diagnosis not present

## 2022-01-01 DIAGNOSIS — R7881 Bacteremia: Secondary | ICD-10-CM | POA: Diagnosis not present

## 2022-01-01 DIAGNOSIS — M4640 Discitis, unspecified, site unspecified: Secondary | ICD-10-CM | POA: Diagnosis not present

## 2022-01-01 DIAGNOSIS — R69 Illness, unspecified: Secondary | ICD-10-CM | POA: Diagnosis not present

## 2022-01-01 DIAGNOSIS — B9562 Methicillin resistant Staphylococcus aureus infection as the cause of diseases classified elsewhere: Secondary | ICD-10-CM | POA: Diagnosis not present

## 2022-01-01 DIAGNOSIS — I82621 Acute embolism and thrombosis of deep veins of right upper extremity: Secondary | ICD-10-CM | POA: Diagnosis not present

## 2022-01-02 DIAGNOSIS — R7881 Bacteremia: Secondary | ICD-10-CM | POA: Diagnosis not present

## 2022-01-02 DIAGNOSIS — M4644 Discitis, unspecified, thoracic region: Secondary | ICD-10-CM | POA: Diagnosis not present

## 2022-01-02 DIAGNOSIS — I330 Acute and subacute infective endocarditis: Secondary | ICD-10-CM | POA: Diagnosis not present

## 2022-01-02 DIAGNOSIS — M4646 Discitis, unspecified, lumbar region: Secondary | ICD-10-CM | POA: Diagnosis not present

## 2022-01-02 DIAGNOSIS — I82621 Acute embolism and thrombosis of deep veins of right upper extremity: Secondary | ICD-10-CM | POA: Diagnosis not present

## 2022-01-02 DIAGNOSIS — B9562 Methicillin resistant Staphylococcus aureus infection as the cause of diseases classified elsewhere: Secondary | ICD-10-CM | POA: Diagnosis not present

## 2022-01-02 DIAGNOSIS — R69 Illness, unspecified: Secondary | ICD-10-CM | POA: Diagnosis not present

## 2022-01-03 DIAGNOSIS — M4644 Discitis, unspecified, thoracic region: Secondary | ICD-10-CM | POA: Diagnosis not present

## 2022-01-03 DIAGNOSIS — R7881 Bacteremia: Secondary | ICD-10-CM | POA: Diagnosis not present

## 2022-01-03 DIAGNOSIS — R69 Illness, unspecified: Secondary | ICD-10-CM | POA: Diagnosis not present

## 2022-01-03 DIAGNOSIS — M4646 Discitis, unspecified, lumbar region: Secondary | ICD-10-CM | POA: Diagnosis not present

## 2022-01-03 DIAGNOSIS — I330 Acute and subacute infective endocarditis: Secondary | ICD-10-CM | POA: Diagnosis not present

## 2022-01-03 DIAGNOSIS — I82621 Acute embolism and thrombosis of deep veins of right upper extremity: Secondary | ICD-10-CM | POA: Diagnosis not present

## 2022-01-03 DIAGNOSIS — B9562 Methicillin resistant Staphylococcus aureus infection as the cause of diseases classified elsewhere: Secondary | ICD-10-CM | POA: Diagnosis not present

## 2022-01-04 DIAGNOSIS — M4644 Discitis, unspecified, thoracic region: Secondary | ICD-10-CM | POA: Diagnosis not present

## 2022-01-04 DIAGNOSIS — R69 Illness, unspecified: Secondary | ICD-10-CM | POA: Diagnosis not present

## 2022-01-04 DIAGNOSIS — B9562 Methicillin resistant Staphylococcus aureus infection as the cause of diseases classified elsewhere: Secondary | ICD-10-CM | POA: Diagnosis not present

## 2022-01-04 DIAGNOSIS — I330 Acute and subacute infective endocarditis: Secondary | ICD-10-CM | POA: Diagnosis not present

## 2022-01-04 DIAGNOSIS — R7881 Bacteremia: Secondary | ICD-10-CM | POA: Diagnosis not present

## 2022-01-04 DIAGNOSIS — M4646 Discitis, unspecified, lumbar region: Secondary | ICD-10-CM | POA: Diagnosis not present

## 2022-01-04 DIAGNOSIS — I82621 Acute embolism and thrombosis of deep veins of right upper extremity: Secondary | ICD-10-CM | POA: Diagnosis not present

## 2022-01-05 DIAGNOSIS — R69 Illness, unspecified: Secondary | ICD-10-CM | POA: Diagnosis not present

## 2022-01-05 DIAGNOSIS — I330 Acute and subacute infective endocarditis: Secondary | ICD-10-CM | POA: Diagnosis not present

## 2022-01-05 DIAGNOSIS — G4701 Insomnia due to medical condition: Secondary | ICD-10-CM | POA: Diagnosis not present

## 2022-01-05 DIAGNOSIS — I82621 Acute embolism and thrombosis of deep veins of right upper extremity: Secondary | ICD-10-CM | POA: Diagnosis not present

## 2022-01-05 DIAGNOSIS — B9562 Methicillin resistant Staphylococcus aureus infection as the cause of diseases classified elsewhere: Secondary | ICD-10-CM | POA: Diagnosis not present

## 2022-01-05 DIAGNOSIS — R7881 Bacteremia: Secondary | ICD-10-CM | POA: Diagnosis not present

## 2022-01-05 DIAGNOSIS — M4646 Discitis, unspecified, lumbar region: Secondary | ICD-10-CM | POA: Diagnosis not present

## 2022-01-05 DIAGNOSIS — M4644 Discitis, unspecified, thoracic region: Secondary | ICD-10-CM | POA: Diagnosis not present

## 2022-01-05 DIAGNOSIS — M4624 Osteomyelitis of vertebra, thoracic region: Secondary | ICD-10-CM | POA: Diagnosis not present

## 2022-01-05 DIAGNOSIS — M4640 Discitis, unspecified, site unspecified: Secondary | ICD-10-CM | POA: Diagnosis not present

## 2022-01-06 DIAGNOSIS — G4701 Insomnia due to medical condition: Secondary | ICD-10-CM | POA: Diagnosis not present

## 2022-01-06 DIAGNOSIS — I330 Acute and subacute infective endocarditis: Secondary | ICD-10-CM | POA: Diagnosis not present

## 2022-01-06 DIAGNOSIS — R7881 Bacteremia: Secondary | ICD-10-CM | POA: Diagnosis not present

## 2022-01-06 DIAGNOSIS — M4624 Osteomyelitis of vertebra, thoracic region: Secondary | ICD-10-CM | POA: Diagnosis not present

## 2022-01-06 DIAGNOSIS — M4644 Discitis, unspecified, thoracic region: Secondary | ICD-10-CM | POA: Diagnosis not present

## 2022-01-06 DIAGNOSIS — B9562 Methicillin resistant Staphylococcus aureus infection as the cause of diseases classified elsewhere: Secondary | ICD-10-CM | POA: Diagnosis not present

## 2022-01-06 DIAGNOSIS — M4646 Discitis, unspecified, lumbar region: Secondary | ICD-10-CM | POA: Diagnosis not present

## 2022-01-06 DIAGNOSIS — I82621 Acute embolism and thrombosis of deep veins of right upper extremity: Secondary | ICD-10-CM | POA: Diagnosis not present

## 2022-01-06 DIAGNOSIS — M4640 Discitis, unspecified, site unspecified: Secondary | ICD-10-CM | POA: Diagnosis not present

## 2022-01-06 DIAGNOSIS — R69 Illness, unspecified: Secondary | ICD-10-CM | POA: Diagnosis not present

## 2022-01-07 DIAGNOSIS — G4701 Insomnia due to medical condition: Secondary | ICD-10-CM | POA: Diagnosis not present

## 2022-01-07 DIAGNOSIS — M4640 Discitis, unspecified, site unspecified: Secondary | ICD-10-CM | POA: Diagnosis not present

## 2022-01-07 DIAGNOSIS — R7881 Bacteremia: Secondary | ICD-10-CM | POA: Diagnosis not present

## 2022-01-07 DIAGNOSIS — Z8619 Personal history of other infectious and parasitic diseases: Secondary | ICD-10-CM | POA: Diagnosis not present

## 2022-01-07 DIAGNOSIS — M4620 Osteomyelitis of vertebra, site unspecified: Secondary | ICD-10-CM | POA: Diagnosis not present

## 2022-01-07 DIAGNOSIS — R69 Illness, unspecified: Secondary | ICD-10-CM | POA: Diagnosis not present

## 2022-01-07 DIAGNOSIS — B9562 Methicillin resistant Staphylococcus aureus infection as the cause of diseases classified elsewhere: Secondary | ICD-10-CM | POA: Diagnosis not present

## 2022-01-07 DIAGNOSIS — M4624 Osteomyelitis of vertebra, thoracic region: Secondary | ICD-10-CM | POA: Diagnosis not present

## 2022-01-07 DIAGNOSIS — M4644 Discitis, unspecified, thoracic region: Secondary | ICD-10-CM | POA: Diagnosis not present

## 2022-01-08 DIAGNOSIS — G4701 Insomnia due to medical condition: Secondary | ICD-10-CM | POA: Diagnosis not present

## 2022-01-08 DIAGNOSIS — M4644 Discitis, unspecified, thoracic region: Secondary | ICD-10-CM | POA: Diagnosis not present

## 2022-01-08 DIAGNOSIS — B9562 Methicillin resistant Staphylococcus aureus infection as the cause of diseases classified elsewhere: Secondary | ICD-10-CM | POA: Diagnosis not present

## 2022-01-08 DIAGNOSIS — R69 Illness, unspecified: Secondary | ICD-10-CM | POA: Diagnosis not present

## 2022-01-08 DIAGNOSIS — R7881 Bacteremia: Secondary | ICD-10-CM | POA: Diagnosis not present

## 2022-01-08 DIAGNOSIS — M4620 Osteomyelitis of vertebra, site unspecified: Secondary | ICD-10-CM | POA: Diagnosis not present

## 2022-01-08 DIAGNOSIS — M4640 Discitis, unspecified, site unspecified: Secondary | ICD-10-CM | POA: Diagnosis not present

## 2022-01-08 DIAGNOSIS — M4624 Osteomyelitis of vertebra, thoracic region: Secondary | ICD-10-CM | POA: Diagnosis not present

## 2022-01-08 DIAGNOSIS — Z8619 Personal history of other infectious and parasitic diseases: Secondary | ICD-10-CM | POA: Diagnosis not present

## 2022-01-09 DIAGNOSIS — R69 Illness, unspecified: Secondary | ICD-10-CM | POA: Diagnosis not present

## 2022-01-09 DIAGNOSIS — I82621 Acute embolism and thrombosis of deep veins of right upper extremity: Secondary | ICD-10-CM | POA: Diagnosis not present

## 2022-01-09 DIAGNOSIS — R7881 Bacteremia: Secondary | ICD-10-CM | POA: Diagnosis not present

## 2022-01-09 DIAGNOSIS — G4701 Insomnia due to medical condition: Secondary | ICD-10-CM | POA: Diagnosis not present

## 2022-01-09 DIAGNOSIS — M4620 Osteomyelitis of vertebra, site unspecified: Secondary | ICD-10-CM | POA: Diagnosis not present

## 2022-01-09 DIAGNOSIS — I330 Acute and subacute infective endocarditis: Secondary | ICD-10-CM | POA: Diagnosis not present

## 2022-01-09 DIAGNOSIS — M4644 Discitis, unspecified, thoracic region: Secondary | ICD-10-CM | POA: Diagnosis not present

## 2022-01-09 DIAGNOSIS — M4640 Discitis, unspecified, site unspecified: Secondary | ICD-10-CM | POA: Diagnosis not present

## 2022-01-09 DIAGNOSIS — M4634 Infection of intervertebral disc (pyogenic), thoracic region: Secondary | ICD-10-CM | POA: Diagnosis not present

## 2022-01-09 DIAGNOSIS — B9562 Methicillin resistant Staphylococcus aureus infection as the cause of diseases classified elsewhere: Secondary | ICD-10-CM | POA: Diagnosis not present

## 2022-01-09 DIAGNOSIS — Z8619 Personal history of other infectious and parasitic diseases: Secondary | ICD-10-CM | POA: Diagnosis not present

## 2022-01-09 DIAGNOSIS — M4624 Osteomyelitis of vertebra, thoracic region: Secondary | ICD-10-CM | POA: Diagnosis not present

## 2022-01-10 DIAGNOSIS — M4624 Osteomyelitis of vertebra, thoracic region: Secondary | ICD-10-CM | POA: Diagnosis not present

## 2022-01-10 DIAGNOSIS — G4701 Insomnia due to medical condition: Secondary | ICD-10-CM | POA: Diagnosis not present

## 2022-01-10 DIAGNOSIS — M4640 Discitis, unspecified, site unspecified: Secondary | ICD-10-CM | POA: Diagnosis not present

## 2022-01-10 DIAGNOSIS — M4644 Discitis, unspecified, thoracic region: Secondary | ICD-10-CM | POA: Diagnosis not present

## 2022-01-10 DIAGNOSIS — A4102 Sepsis due to Methicillin resistant Staphylococcus aureus: Secondary | ICD-10-CM | POA: Diagnosis not present

## 2022-01-10 DIAGNOSIS — R69 Illness, unspecified: Secondary | ICD-10-CM | POA: Diagnosis not present

## 2022-01-10 DIAGNOSIS — M4646 Discitis, unspecified, lumbar region: Secondary | ICD-10-CM | POA: Diagnosis not present

## 2022-01-10 DIAGNOSIS — R7881 Bacteremia: Secondary | ICD-10-CM | POA: Diagnosis not present

## 2022-01-10 DIAGNOSIS — B9562 Methicillin resistant Staphylococcus aureus infection as the cause of diseases classified elsewhere: Secondary | ICD-10-CM | POA: Diagnosis not present

## 2022-01-11 DIAGNOSIS — M4644 Discitis, unspecified, thoracic region: Secondary | ICD-10-CM | POA: Diagnosis not present

## 2022-01-11 DIAGNOSIS — M4624 Osteomyelitis of vertebra, thoracic region: Secondary | ICD-10-CM | POA: Diagnosis not present

## 2022-01-11 DIAGNOSIS — M4646 Discitis, unspecified, lumbar region: Secondary | ICD-10-CM | POA: Diagnosis not present

## 2022-01-11 DIAGNOSIS — M4640 Discitis, unspecified, site unspecified: Secondary | ICD-10-CM | POA: Diagnosis not present

## 2022-01-11 DIAGNOSIS — R69 Illness, unspecified: Secondary | ICD-10-CM | POA: Diagnosis not present

## 2022-01-11 DIAGNOSIS — B9562 Methicillin resistant Staphylococcus aureus infection as the cause of diseases classified elsewhere: Secondary | ICD-10-CM | POA: Diagnosis not present

## 2022-01-11 DIAGNOSIS — R7881 Bacteremia: Secondary | ICD-10-CM | POA: Diagnosis not present

## 2022-01-11 DIAGNOSIS — A4102 Sepsis due to Methicillin resistant Staphylococcus aureus: Secondary | ICD-10-CM | POA: Diagnosis not present

## 2022-01-11 DIAGNOSIS — G4701 Insomnia due to medical condition: Secondary | ICD-10-CM | POA: Diagnosis not present

## 2022-01-12 DIAGNOSIS — R69 Illness, unspecified: Secondary | ICD-10-CM | POA: Diagnosis not present

## 2022-01-12 DIAGNOSIS — R7881 Bacteremia: Secondary | ICD-10-CM | POA: Diagnosis not present

## 2022-01-12 DIAGNOSIS — M4624 Osteomyelitis of vertebra, thoracic region: Secondary | ICD-10-CM | POA: Diagnosis not present

## 2022-01-12 DIAGNOSIS — B9562 Methicillin resistant Staphylococcus aureus infection as the cause of diseases classified elsewhere: Secondary | ICD-10-CM | POA: Diagnosis not present

## 2022-01-12 DIAGNOSIS — G4701 Insomnia due to medical condition: Secondary | ICD-10-CM | POA: Diagnosis not present

## 2022-01-12 DIAGNOSIS — M4644 Discitis, unspecified, thoracic region: Secondary | ICD-10-CM | POA: Diagnosis not present

## 2022-01-12 DIAGNOSIS — M4640 Discitis, unspecified, site unspecified: Secondary | ICD-10-CM | POA: Diagnosis not present

## 2022-01-13 DIAGNOSIS — B9562 Methicillin resistant Staphylococcus aureus infection as the cause of diseases classified elsewhere: Secondary | ICD-10-CM | POA: Diagnosis not present

## 2022-01-13 DIAGNOSIS — F1910 Other psychoactive substance abuse, uncomplicated: Secondary | ICD-10-CM | POA: Diagnosis not present

## 2022-01-13 DIAGNOSIS — M4624 Osteomyelitis of vertebra, thoracic region: Secondary | ICD-10-CM | POA: Diagnosis not present

## 2022-01-13 DIAGNOSIS — R7881 Bacteremia: Secondary | ICD-10-CM | POA: Diagnosis not present

## 2022-01-13 DIAGNOSIS — M4640 Discitis, unspecified, site unspecified: Secondary | ICD-10-CM | POA: Diagnosis not present

## 2022-01-13 DIAGNOSIS — G4701 Insomnia due to medical condition: Secondary | ICD-10-CM | POA: Diagnosis not present

## 2022-01-13 DIAGNOSIS — R69 Illness, unspecified: Secondary | ICD-10-CM | POA: Diagnosis not present

## 2022-01-13 DIAGNOSIS — M4644 Discitis, unspecified, thoracic region: Secondary | ICD-10-CM | POA: Diagnosis not present

## 2022-01-14 DIAGNOSIS — B9562 Methicillin resistant Staphylococcus aureus infection as the cause of diseases classified elsewhere: Secondary | ICD-10-CM | POA: Diagnosis not present

## 2022-01-14 DIAGNOSIS — M4644 Discitis, unspecified, thoracic region: Secondary | ICD-10-CM | POA: Diagnosis not present

## 2022-01-14 DIAGNOSIS — G4701 Insomnia due to medical condition: Secondary | ICD-10-CM | POA: Diagnosis not present

## 2022-01-14 DIAGNOSIS — M4640 Discitis, unspecified, site unspecified: Secondary | ICD-10-CM | POA: Diagnosis not present

## 2022-01-14 DIAGNOSIS — R7881 Bacteremia: Secondary | ICD-10-CM | POA: Diagnosis not present

## 2022-01-14 DIAGNOSIS — I330 Acute and subacute infective endocarditis: Secondary | ICD-10-CM | POA: Diagnosis not present

## 2022-01-14 DIAGNOSIS — R69 Illness, unspecified: Secondary | ICD-10-CM | POA: Diagnosis not present

## 2022-01-14 DIAGNOSIS — M4646 Discitis, unspecified, lumbar region: Secondary | ICD-10-CM | POA: Diagnosis not present

## 2022-01-14 DIAGNOSIS — M4624 Osteomyelitis of vertebra, thoracic region: Secondary | ICD-10-CM | POA: Diagnosis not present

## 2022-01-14 DIAGNOSIS — I82621 Acute embolism and thrombosis of deep veins of right upper extremity: Secondary | ICD-10-CM | POA: Diagnosis not present

## 2022-01-14 DIAGNOSIS — M4634 Infection of intervertebral disc (pyogenic), thoracic region: Secondary | ICD-10-CM | POA: Diagnosis not present

## 2022-01-14 DIAGNOSIS — Z5181 Encounter for therapeutic drug level monitoring: Secondary | ICD-10-CM | POA: Diagnosis not present

## 2022-01-15 DIAGNOSIS — Z5181 Encounter for therapeutic drug level monitoring: Secondary | ICD-10-CM | POA: Diagnosis not present

## 2022-01-15 DIAGNOSIS — M4646 Discitis, unspecified, lumbar region: Secondary | ICD-10-CM | POA: Diagnosis not present

## 2022-01-15 DIAGNOSIS — R69 Illness, unspecified: Secondary | ICD-10-CM | POA: Diagnosis not present

## 2022-01-15 DIAGNOSIS — R7881 Bacteremia: Secondary | ICD-10-CM | POA: Diagnosis not present

## 2022-01-15 DIAGNOSIS — M4640 Discitis, unspecified, site unspecified: Secondary | ICD-10-CM | POA: Diagnosis not present

## 2022-01-15 DIAGNOSIS — F1990 Other psychoactive substance use, unspecified, uncomplicated: Secondary | ICD-10-CM | POA: Diagnosis not present

## 2022-01-15 DIAGNOSIS — M4644 Discitis, unspecified, thoracic region: Secondary | ICD-10-CM | POA: Diagnosis not present

## 2022-01-15 DIAGNOSIS — I82621 Acute embolism and thrombosis of deep veins of right upper extremity: Secondary | ICD-10-CM | POA: Diagnosis not present

## 2022-01-15 DIAGNOSIS — M4624 Osteomyelitis of vertebra, thoracic region: Secondary | ICD-10-CM | POA: Diagnosis not present

## 2022-01-15 DIAGNOSIS — G4701 Insomnia due to medical condition: Secondary | ICD-10-CM | POA: Diagnosis not present

## 2022-01-15 DIAGNOSIS — B9562 Methicillin resistant Staphylococcus aureus infection as the cause of diseases classified elsewhere: Secondary | ICD-10-CM | POA: Diagnosis not present

## 2022-01-15 DIAGNOSIS — I330 Acute and subacute infective endocarditis: Secondary | ICD-10-CM | POA: Diagnosis not present

## 2022-01-15 DIAGNOSIS — M4634 Infection of intervertebral disc (pyogenic), thoracic region: Secondary | ICD-10-CM | POA: Diagnosis not present

## 2022-01-16 DIAGNOSIS — M4624 Osteomyelitis of vertebra, thoracic region: Secondary | ICD-10-CM | POA: Diagnosis not present

## 2022-01-16 DIAGNOSIS — Z5181 Encounter for therapeutic drug level monitoring: Secondary | ICD-10-CM | POA: Diagnosis not present

## 2022-01-16 DIAGNOSIS — M4640 Discitis, unspecified, site unspecified: Secondary | ICD-10-CM | POA: Diagnosis not present

## 2022-01-16 DIAGNOSIS — R69 Illness, unspecified: Secondary | ICD-10-CM | POA: Diagnosis not present

## 2022-01-16 DIAGNOSIS — M4644 Discitis, unspecified, thoracic region: Secondary | ICD-10-CM | POA: Diagnosis not present

## 2022-01-16 DIAGNOSIS — G4701 Insomnia due to medical condition: Secondary | ICD-10-CM | POA: Diagnosis not present

## 2022-01-16 DIAGNOSIS — R7881 Bacteremia: Secondary | ICD-10-CM | POA: Diagnosis not present

## 2022-01-16 DIAGNOSIS — M4646 Discitis, unspecified, lumbar region: Secondary | ICD-10-CM | POA: Diagnosis not present

## 2022-01-16 DIAGNOSIS — B9562 Methicillin resistant Staphylococcus aureus infection as the cause of diseases classified elsewhere: Secondary | ICD-10-CM | POA: Diagnosis not present

## 2022-01-17 DIAGNOSIS — G4701 Insomnia due to medical condition: Secondary | ICD-10-CM | POA: Diagnosis not present

## 2022-01-17 DIAGNOSIS — Z5181 Encounter for therapeutic drug level monitoring: Secondary | ICD-10-CM | POA: Diagnosis not present

## 2022-01-17 DIAGNOSIS — R7881 Bacteremia: Secondary | ICD-10-CM | POA: Diagnosis not present

## 2022-01-17 DIAGNOSIS — M4646 Discitis, unspecified, lumbar region: Secondary | ICD-10-CM | POA: Diagnosis not present

## 2022-01-17 DIAGNOSIS — M4624 Osteomyelitis of vertebra, thoracic region: Secondary | ICD-10-CM | POA: Diagnosis not present

## 2022-01-17 DIAGNOSIS — M4644 Discitis, unspecified, thoracic region: Secondary | ICD-10-CM | POA: Diagnosis not present

## 2022-01-17 DIAGNOSIS — B9562 Methicillin resistant Staphylococcus aureus infection as the cause of diseases classified elsewhere: Secondary | ICD-10-CM | POA: Diagnosis not present

## 2022-01-17 DIAGNOSIS — M4640 Discitis, unspecified, site unspecified: Secondary | ICD-10-CM | POA: Diagnosis not present

## 2022-01-17 DIAGNOSIS — R69 Illness, unspecified: Secondary | ICD-10-CM | POA: Diagnosis not present

## 2022-01-18 DIAGNOSIS — M4634 Infection of intervertebral disc (pyogenic), thoracic region: Secondary | ICD-10-CM | POA: Diagnosis not present

## 2022-01-18 DIAGNOSIS — I330 Acute and subacute infective endocarditis: Secondary | ICD-10-CM | POA: Diagnosis not present

## 2022-01-18 DIAGNOSIS — Z5181 Encounter for therapeutic drug level monitoring: Secondary | ICD-10-CM | POA: Diagnosis not present

## 2022-01-18 DIAGNOSIS — G4701 Insomnia due to medical condition: Secondary | ICD-10-CM | POA: Diagnosis not present

## 2022-01-18 DIAGNOSIS — R7881 Bacteremia: Secondary | ICD-10-CM | POA: Diagnosis not present

## 2022-01-18 DIAGNOSIS — R69 Illness, unspecified: Secondary | ICD-10-CM | POA: Diagnosis not present

## 2022-01-18 DIAGNOSIS — B9562 Methicillin resistant Staphylococcus aureus infection as the cause of diseases classified elsewhere: Secondary | ICD-10-CM | POA: Diagnosis not present

## 2022-01-18 DIAGNOSIS — M4640 Discitis, unspecified, site unspecified: Secondary | ICD-10-CM | POA: Diagnosis not present

## 2022-01-18 DIAGNOSIS — M4624 Osteomyelitis of vertebra, thoracic region: Secondary | ICD-10-CM | POA: Diagnosis not present

## 2022-01-18 DIAGNOSIS — M4646 Discitis, unspecified, lumbar region: Secondary | ICD-10-CM | POA: Diagnosis not present

## 2022-01-18 DIAGNOSIS — I82621 Acute embolism and thrombosis of deep veins of right upper extremity: Secondary | ICD-10-CM | POA: Diagnosis not present

## 2022-01-18 DIAGNOSIS — M4644 Discitis, unspecified, thoracic region: Secondary | ICD-10-CM | POA: Diagnosis not present

## 2022-01-19 DIAGNOSIS — M4640 Discitis, unspecified, site unspecified: Secondary | ICD-10-CM | POA: Diagnosis not present

## 2022-01-19 DIAGNOSIS — Z5181 Encounter for therapeutic drug level monitoring: Secondary | ICD-10-CM | POA: Diagnosis not present

## 2022-01-19 DIAGNOSIS — M4624 Osteomyelitis of vertebra, thoracic region: Secondary | ICD-10-CM | POA: Diagnosis not present

## 2022-01-19 DIAGNOSIS — M4644 Discitis, unspecified, thoracic region: Secondary | ICD-10-CM | POA: Diagnosis not present

## 2022-01-19 DIAGNOSIS — B9562 Methicillin resistant Staphylococcus aureus infection as the cause of diseases classified elsewhere: Secondary | ICD-10-CM | POA: Diagnosis not present

## 2022-01-19 DIAGNOSIS — R7881 Bacteremia: Secondary | ICD-10-CM | POA: Diagnosis not present

## 2022-01-19 DIAGNOSIS — M4646 Discitis, unspecified, lumbar region: Secondary | ICD-10-CM | POA: Diagnosis not present

## 2022-01-19 DIAGNOSIS — G4701 Insomnia due to medical condition: Secondary | ICD-10-CM | POA: Diagnosis not present

## 2022-01-19 DIAGNOSIS — R69 Illness, unspecified: Secondary | ICD-10-CM | POA: Diagnosis not present

## 2022-01-20 DIAGNOSIS — M4640 Discitis, unspecified, site unspecified: Secondary | ICD-10-CM | POA: Diagnosis not present

## 2022-01-20 DIAGNOSIS — B9562 Methicillin resistant Staphylococcus aureus infection as the cause of diseases classified elsewhere: Secondary | ICD-10-CM | POA: Diagnosis not present

## 2022-01-20 DIAGNOSIS — M4644 Discitis, unspecified, thoracic region: Secondary | ICD-10-CM | POA: Diagnosis not present

## 2022-01-20 DIAGNOSIS — M4646 Discitis, unspecified, lumbar region: Secondary | ICD-10-CM | POA: Diagnosis not present

## 2022-01-20 DIAGNOSIS — M4624 Osteomyelitis of vertebra, thoracic region: Secondary | ICD-10-CM | POA: Diagnosis not present

## 2022-01-20 DIAGNOSIS — R7881 Bacteremia: Secondary | ICD-10-CM | POA: Diagnosis not present

## 2022-01-20 DIAGNOSIS — R69 Illness, unspecified: Secondary | ICD-10-CM | POA: Diagnosis not present

## 2022-01-20 DIAGNOSIS — Z5181 Encounter for therapeutic drug level monitoring: Secondary | ICD-10-CM | POA: Diagnosis not present

## 2022-01-20 DIAGNOSIS — G4701 Insomnia due to medical condition: Secondary | ICD-10-CM | POA: Diagnosis not present

## 2022-01-21 DIAGNOSIS — A4102 Sepsis due to Methicillin resistant Staphylococcus aureus: Secondary | ICD-10-CM | POA: Diagnosis not present

## 2022-01-21 DIAGNOSIS — B9562 Methicillin resistant Staphylococcus aureus infection as the cause of diseases classified elsewhere: Secondary | ICD-10-CM | POA: Diagnosis not present

## 2022-01-21 DIAGNOSIS — R69 Illness, unspecified: Secondary | ICD-10-CM | POA: Diagnosis not present

## 2022-01-21 DIAGNOSIS — M4624 Osteomyelitis of vertebra, thoracic region: Secondary | ICD-10-CM | POA: Diagnosis not present

## 2022-01-21 DIAGNOSIS — G4701 Insomnia due to medical condition: Secondary | ICD-10-CM | POA: Diagnosis not present

## 2022-01-21 DIAGNOSIS — I82621 Acute embolism and thrombosis of deep veins of right upper extremity: Secondary | ICD-10-CM | POA: Diagnosis not present

## 2022-01-21 DIAGNOSIS — R7881 Bacteremia: Secondary | ICD-10-CM | POA: Diagnosis not present

## 2022-01-21 DIAGNOSIS — M4634 Infection of intervertebral disc (pyogenic), thoracic region: Secondary | ICD-10-CM | POA: Diagnosis not present

## 2022-01-21 DIAGNOSIS — M4640 Discitis, unspecified, site unspecified: Secondary | ICD-10-CM | POA: Diagnosis not present

## 2022-01-21 DIAGNOSIS — M4644 Discitis, unspecified, thoracic region: Secondary | ICD-10-CM | POA: Diagnosis not present

## 2022-01-21 DIAGNOSIS — I330 Acute and subacute infective endocarditis: Secondary | ICD-10-CM | POA: Diagnosis not present

## 2022-02-26 DIAGNOSIS — R69 Illness, unspecified: Secondary | ICD-10-CM | POA: Diagnosis not present

## 2022-02-26 DIAGNOSIS — Z79899 Other long term (current) drug therapy: Secondary | ICD-10-CM | POA: Diagnosis not present

## 2022-04-02 IMAGING — DX DG CHEST 1V PORT
1 series · 1 of 1 positions shown · non-contrast
Comparison: None.

CLINICAL DATA: Sepsis

EXAM:
PORTABLE CHEST 1 VIEW

[chest ap]
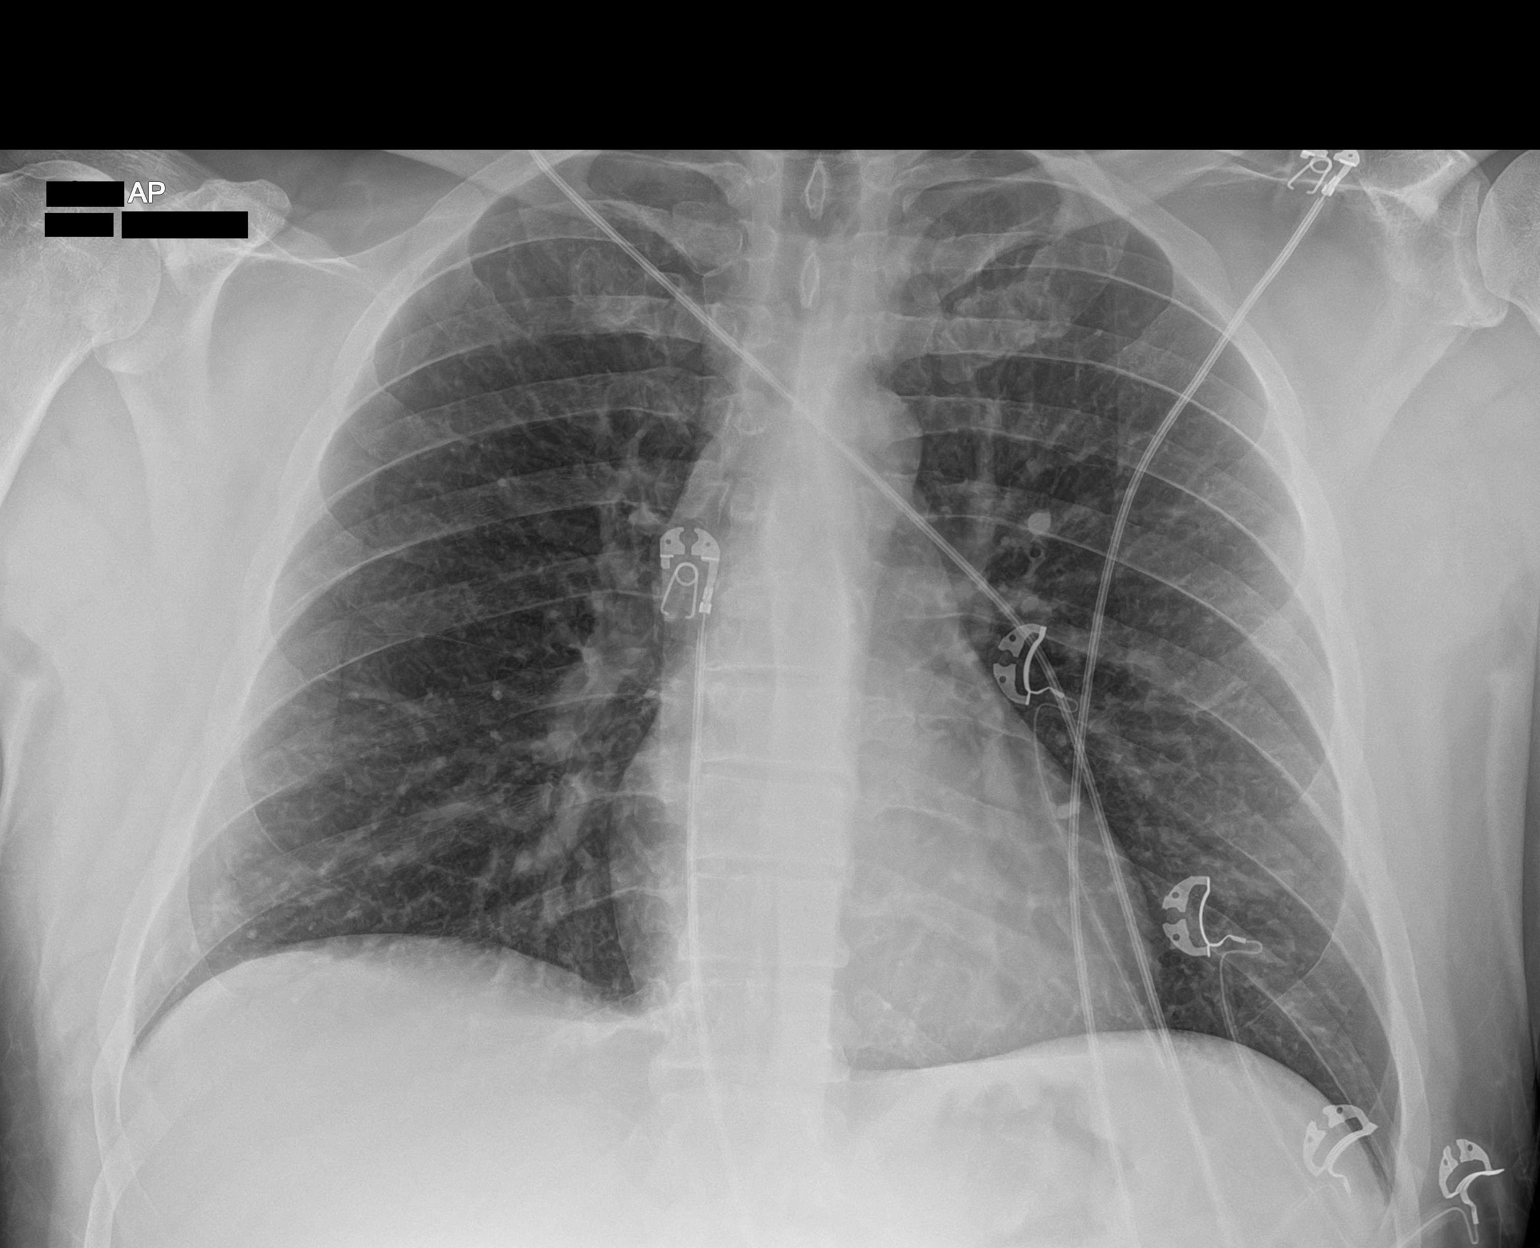

[1 of 1 positions shown; findings below may reference images not displayed]

FINDINGS: The heart size and mediastinal contours are within normal limits.
Both lungs are clear. The visualized skeletal structures are
unremarkable.
IMPRESSION: No active disease.

## 2022-04-08 DIAGNOSIS — L03113 Cellulitis of right upper limb: Secondary | ICD-10-CM | POA: Diagnosis not present

## 2022-04-08 DIAGNOSIS — L02413 Cutaneous abscess of right upper limb: Secondary | ICD-10-CM | POA: Diagnosis not present

## 2022-04-08 DIAGNOSIS — M549 Dorsalgia, unspecified: Secondary | ICD-10-CM | POA: Diagnosis not present

## 2022-04-08 DIAGNOSIS — G8929 Other chronic pain: Secondary | ICD-10-CM | POA: Diagnosis not present

## 2022-05-21 DIAGNOSIS — F1120 Opioid dependence, uncomplicated: Secondary | ICD-10-CM | POA: Diagnosis not present

## 2022-05-22 DIAGNOSIS — R69 Illness, unspecified: Secondary | ICD-10-CM | POA: Diagnosis not present

## 2022-05-22 DIAGNOSIS — F1120 Opioid dependence, uncomplicated: Secondary | ICD-10-CM | POA: Diagnosis not present

## 2022-05-23 DIAGNOSIS — R69 Illness, unspecified: Secondary | ICD-10-CM | POA: Diagnosis not present

## 2022-05-23 DIAGNOSIS — F1120 Opioid dependence, uncomplicated: Secondary | ICD-10-CM | POA: Diagnosis not present

## 2022-05-24 DIAGNOSIS — F1120 Opioid dependence, uncomplicated: Secondary | ICD-10-CM | POA: Diagnosis not present

## 2022-05-24 DIAGNOSIS — R69 Illness, unspecified: Secondary | ICD-10-CM | POA: Diagnosis not present

## 2022-05-25 DIAGNOSIS — R69 Illness, unspecified: Secondary | ICD-10-CM | POA: Diagnosis not present

## 2022-05-26 DIAGNOSIS — R69 Illness, unspecified: Secondary | ICD-10-CM | POA: Diagnosis not present

## 2022-05-27 DIAGNOSIS — F1120 Opioid dependence, uncomplicated: Secondary | ICD-10-CM | POA: Diagnosis not present

## 2022-06-13 DIAGNOSIS — R69 Illness, unspecified: Secondary | ICD-10-CM | POA: Diagnosis not present

## 2022-06-14 DIAGNOSIS — R69 Illness, unspecified: Secondary | ICD-10-CM | POA: Diagnosis not present

## 2022-06-15 DIAGNOSIS — R69 Illness, unspecified: Secondary | ICD-10-CM | POA: Diagnosis not present

## 2022-06-16 DIAGNOSIS — R69 Illness, unspecified: Secondary | ICD-10-CM | POA: Diagnosis not present

## 2022-06-17 DIAGNOSIS — R69 Illness, unspecified: Secondary | ICD-10-CM | POA: Diagnosis not present

## 2022-06-18 DIAGNOSIS — R69 Illness, unspecified: Secondary | ICD-10-CM | POA: Diagnosis not present

## 2022-06-19 DIAGNOSIS — F1510 Other stimulant abuse, uncomplicated: Secondary | ICD-10-CM | POA: Diagnosis not present

## 2022-06-19 DIAGNOSIS — R69 Illness, unspecified: Secondary | ICD-10-CM | POA: Diagnosis not present

## 2022-06-20 DIAGNOSIS — R69 Illness, unspecified: Secondary | ICD-10-CM | POA: Diagnosis not present

## 2022-06-20 DIAGNOSIS — F1510 Other stimulant abuse, uncomplicated: Secondary | ICD-10-CM | POA: Diagnosis not present

## 2022-06-21 DIAGNOSIS — R69 Illness, unspecified: Secondary | ICD-10-CM | POA: Diagnosis not present

## 2022-06-21 DIAGNOSIS — Z79899 Other long term (current) drug therapy: Secondary | ICD-10-CM | POA: Diagnosis not present

## 2022-06-21 DIAGNOSIS — F1510 Other stimulant abuse, uncomplicated: Secondary | ICD-10-CM | POA: Diagnosis not present

## 2022-06-22 DIAGNOSIS — F1510 Other stimulant abuse, uncomplicated: Secondary | ICD-10-CM | POA: Diagnosis not present

## 2022-06-22 DIAGNOSIS — R69 Illness, unspecified: Secondary | ICD-10-CM | POA: Diagnosis not present

## 2022-06-23 DIAGNOSIS — R69 Illness, unspecified: Secondary | ICD-10-CM | POA: Diagnosis not present

## 2022-06-23 DIAGNOSIS — F1510 Other stimulant abuse, uncomplicated: Secondary | ICD-10-CM | POA: Diagnosis not present

## 2022-06-24 DIAGNOSIS — F1510 Other stimulant abuse, uncomplicated: Secondary | ICD-10-CM | POA: Diagnosis not present

## 2022-06-24 DIAGNOSIS — R69 Illness, unspecified: Secondary | ICD-10-CM | POA: Diagnosis not present

## 2022-06-25 DIAGNOSIS — F1510 Other stimulant abuse, uncomplicated: Secondary | ICD-10-CM | POA: Diagnosis not present

## 2022-06-25 DIAGNOSIS — R69 Illness, unspecified: Secondary | ICD-10-CM | POA: Diagnosis not present

## 2022-06-26 DIAGNOSIS — R69 Illness, unspecified: Secondary | ICD-10-CM | POA: Diagnosis not present

## 2022-06-26 DIAGNOSIS — Z79899 Other long term (current) drug therapy: Secondary | ICD-10-CM | POA: Diagnosis not present

## 2022-06-26 DIAGNOSIS — F1510 Other stimulant abuse, uncomplicated: Secondary | ICD-10-CM | POA: Diagnosis not present

## 2022-06-27 DIAGNOSIS — F1510 Other stimulant abuse, uncomplicated: Secondary | ICD-10-CM | POA: Diagnosis not present

## 2022-06-27 DIAGNOSIS — R69 Illness, unspecified: Secondary | ICD-10-CM | POA: Diagnosis not present

## 2022-06-28 DIAGNOSIS — F1510 Other stimulant abuse, uncomplicated: Secondary | ICD-10-CM | POA: Diagnosis not present

## 2022-06-28 DIAGNOSIS — R69 Illness, unspecified: Secondary | ICD-10-CM | POA: Diagnosis not present

## 2022-06-28 DIAGNOSIS — Z79899 Other long term (current) drug therapy: Secondary | ICD-10-CM | POA: Diagnosis not present

## 2022-06-29 DIAGNOSIS — F1510 Other stimulant abuse, uncomplicated: Secondary | ICD-10-CM | POA: Diagnosis not present

## 2022-06-29 DIAGNOSIS — R69 Illness, unspecified: Secondary | ICD-10-CM | POA: Diagnosis not present

## 2022-06-30 DIAGNOSIS — F1510 Other stimulant abuse, uncomplicated: Secondary | ICD-10-CM | POA: Diagnosis not present

## 2022-06-30 DIAGNOSIS — R69 Illness, unspecified: Secondary | ICD-10-CM | POA: Diagnosis not present

## 2022-07-01 DIAGNOSIS — R69 Illness, unspecified: Secondary | ICD-10-CM | POA: Diagnosis not present

## 2022-07-01 DIAGNOSIS — F1510 Other stimulant abuse, uncomplicated: Secondary | ICD-10-CM | POA: Diagnosis not present

## 2022-07-02 DIAGNOSIS — Z79899 Other long term (current) drug therapy: Secondary | ICD-10-CM | POA: Diagnosis not present

## 2022-07-02 DIAGNOSIS — F1510 Other stimulant abuse, uncomplicated: Secondary | ICD-10-CM | POA: Diagnosis not present

## 2022-07-02 DIAGNOSIS — R69 Illness, unspecified: Secondary | ICD-10-CM | POA: Diagnosis not present

## 2022-07-03 DIAGNOSIS — R69 Illness, unspecified: Secondary | ICD-10-CM | POA: Diagnosis not present

## 2022-07-03 DIAGNOSIS — F1510 Other stimulant abuse, uncomplicated: Secondary | ICD-10-CM | POA: Diagnosis not present

## 2022-07-04 DIAGNOSIS — Z79899 Other long term (current) drug therapy: Secondary | ICD-10-CM | POA: Diagnosis not present

## 2022-07-04 DIAGNOSIS — F1510 Other stimulant abuse, uncomplicated: Secondary | ICD-10-CM | POA: Diagnosis not present

## 2022-07-04 DIAGNOSIS — R69 Illness, unspecified: Secondary | ICD-10-CM | POA: Diagnosis not present

## 2022-07-05 DIAGNOSIS — F1510 Other stimulant abuse, uncomplicated: Secondary | ICD-10-CM | POA: Diagnosis not present

## 2022-07-05 DIAGNOSIS — R69 Illness, unspecified: Secondary | ICD-10-CM | POA: Diagnosis not present

## 2022-07-06 DIAGNOSIS — R69 Illness, unspecified: Secondary | ICD-10-CM | POA: Diagnosis not present

## 2022-07-06 DIAGNOSIS — F1510 Other stimulant abuse, uncomplicated: Secondary | ICD-10-CM | POA: Diagnosis not present

## 2022-07-07 DIAGNOSIS — R69 Illness, unspecified: Secondary | ICD-10-CM | POA: Diagnosis not present

## 2022-07-07 DIAGNOSIS — F1510 Other stimulant abuse, uncomplicated: Secondary | ICD-10-CM | POA: Diagnosis not present

## 2022-07-08 DIAGNOSIS — R69 Illness, unspecified: Secondary | ICD-10-CM | POA: Diagnosis not present

## 2022-07-08 DIAGNOSIS — F1510 Other stimulant abuse, uncomplicated: Secondary | ICD-10-CM | POA: Diagnosis not present

## 2022-07-08 DIAGNOSIS — Z79899 Other long term (current) drug therapy: Secondary | ICD-10-CM | POA: Diagnosis not present

## 2022-07-09 DIAGNOSIS — F1510 Other stimulant abuse, uncomplicated: Secondary | ICD-10-CM | POA: Diagnosis not present

## 2022-07-09 DIAGNOSIS — R69 Illness, unspecified: Secondary | ICD-10-CM | POA: Diagnosis not present

## 2022-07-10 DIAGNOSIS — R69 Illness, unspecified: Secondary | ICD-10-CM | POA: Diagnosis not present

## 2022-07-10 DIAGNOSIS — F1510 Other stimulant abuse, uncomplicated: Secondary | ICD-10-CM | POA: Diagnosis not present

## 2022-07-11 DIAGNOSIS — Z79899 Other long term (current) drug therapy: Secondary | ICD-10-CM | POA: Diagnosis not present

## 2022-07-11 DIAGNOSIS — F1510 Other stimulant abuse, uncomplicated: Secondary | ICD-10-CM | POA: Diagnosis not present

## 2022-07-11 DIAGNOSIS — R69 Illness, unspecified: Secondary | ICD-10-CM | POA: Diagnosis not present

## 2022-07-12 DIAGNOSIS — F1510 Other stimulant abuse, uncomplicated: Secondary | ICD-10-CM | POA: Diagnosis not present

## 2022-07-12 DIAGNOSIS — R69 Illness, unspecified: Secondary | ICD-10-CM | POA: Diagnosis not present

## 2022-07-13 DIAGNOSIS — F1510 Other stimulant abuse, uncomplicated: Secondary | ICD-10-CM | POA: Diagnosis not present

## 2022-07-13 DIAGNOSIS — R69 Illness, unspecified: Secondary | ICD-10-CM | POA: Diagnosis not present

## 2022-07-14 DIAGNOSIS — F1510 Other stimulant abuse, uncomplicated: Secondary | ICD-10-CM | POA: Diagnosis not present

## 2022-07-14 DIAGNOSIS — R69 Illness, unspecified: Secondary | ICD-10-CM | POA: Diagnosis not present

## 2022-07-16 DIAGNOSIS — R69 Illness, unspecified: Secondary | ICD-10-CM | POA: Diagnosis not present

## 2022-07-16 DIAGNOSIS — Z79899 Other long term (current) drug therapy: Secondary | ICD-10-CM | POA: Diagnosis not present

## 2022-07-16 DIAGNOSIS — F1510 Other stimulant abuse, uncomplicated: Secondary | ICD-10-CM | POA: Diagnosis not present

## 2022-07-17 DIAGNOSIS — F1510 Other stimulant abuse, uncomplicated: Secondary | ICD-10-CM | POA: Diagnosis not present

## 2022-07-17 DIAGNOSIS — R69 Illness, unspecified: Secondary | ICD-10-CM | POA: Diagnosis not present

## 2022-07-18 DIAGNOSIS — R69 Illness, unspecified: Secondary | ICD-10-CM | POA: Diagnosis not present

## 2022-07-18 DIAGNOSIS — F1510 Other stimulant abuse, uncomplicated: Secondary | ICD-10-CM | POA: Diagnosis not present

## 2022-07-18 DIAGNOSIS — Z79899 Other long term (current) drug therapy: Secondary | ICD-10-CM | POA: Diagnosis not present

## 2022-07-19 DIAGNOSIS — F1510 Other stimulant abuse, uncomplicated: Secondary | ICD-10-CM | POA: Diagnosis not present

## 2022-07-19 DIAGNOSIS — R69 Illness, unspecified: Secondary | ICD-10-CM | POA: Diagnosis not present

## 2022-07-20 DIAGNOSIS — F1510 Other stimulant abuse, uncomplicated: Secondary | ICD-10-CM | POA: Diagnosis not present

## 2022-07-20 DIAGNOSIS — R69 Illness, unspecified: Secondary | ICD-10-CM | POA: Diagnosis not present

## 2022-07-21 DIAGNOSIS — R69 Illness, unspecified: Secondary | ICD-10-CM | POA: Diagnosis not present

## 2022-07-21 DIAGNOSIS — F1510 Other stimulant abuse, uncomplicated: Secondary | ICD-10-CM | POA: Diagnosis not present

## 2022-07-22 DIAGNOSIS — R69 Illness, unspecified: Secondary | ICD-10-CM | POA: Diagnosis not present

## 2022-07-22 DIAGNOSIS — Z79899 Other long term (current) drug therapy: Secondary | ICD-10-CM | POA: Diagnosis not present

## 2022-07-22 DIAGNOSIS — F1510 Other stimulant abuse, uncomplicated: Secondary | ICD-10-CM | POA: Diagnosis not present

## 2022-07-23 DIAGNOSIS — F1510 Other stimulant abuse, uncomplicated: Secondary | ICD-10-CM | POA: Diagnosis not present

## 2022-07-23 DIAGNOSIS — R69 Illness, unspecified: Secondary | ICD-10-CM | POA: Diagnosis not present

## 2022-07-24 DIAGNOSIS — Z79899 Other long term (current) drug therapy: Secondary | ICD-10-CM | POA: Diagnosis not present

## 2022-07-24 DIAGNOSIS — F1510 Other stimulant abuse, uncomplicated: Secondary | ICD-10-CM | POA: Diagnosis not present

## 2022-07-24 DIAGNOSIS — R69 Illness, unspecified: Secondary | ICD-10-CM | POA: Diagnosis not present

## 2022-07-25 DIAGNOSIS — F1510 Other stimulant abuse, uncomplicated: Secondary | ICD-10-CM | POA: Diagnosis not present

## 2022-07-25 DIAGNOSIS — R69 Illness, unspecified: Secondary | ICD-10-CM | POA: Diagnosis not present

## 2022-07-26 DIAGNOSIS — R69 Illness, unspecified: Secondary | ICD-10-CM | POA: Diagnosis not present

## 2022-07-26 DIAGNOSIS — F1510 Other stimulant abuse, uncomplicated: Secondary | ICD-10-CM | POA: Diagnosis not present

## 2022-07-27 DIAGNOSIS — R69 Illness, unspecified: Secondary | ICD-10-CM | POA: Diagnosis not present

## 2022-07-27 DIAGNOSIS — F1510 Other stimulant abuse, uncomplicated: Secondary | ICD-10-CM | POA: Diagnosis not present

## 2022-07-28 DIAGNOSIS — R69 Illness, unspecified: Secondary | ICD-10-CM | POA: Diagnosis not present

## 2022-07-28 DIAGNOSIS — F1510 Other stimulant abuse, uncomplicated: Secondary | ICD-10-CM | POA: Diagnosis not present

## 2022-07-29 DIAGNOSIS — R69 Illness, unspecified: Secondary | ICD-10-CM | POA: Diagnosis not present

## 2022-07-29 DIAGNOSIS — F1510 Other stimulant abuse, uncomplicated: Secondary | ICD-10-CM | POA: Diagnosis not present

## 2022-07-30 DIAGNOSIS — Z79899 Other long term (current) drug therapy: Secondary | ICD-10-CM | POA: Diagnosis not present

## 2022-07-30 DIAGNOSIS — F1510 Other stimulant abuse, uncomplicated: Secondary | ICD-10-CM | POA: Diagnosis not present

## 2022-07-30 DIAGNOSIS — R69 Illness, unspecified: Secondary | ICD-10-CM | POA: Diagnosis not present

## 2022-07-31 DIAGNOSIS — R69 Illness, unspecified: Secondary | ICD-10-CM | POA: Diagnosis not present

## 2022-07-31 DIAGNOSIS — F1510 Other stimulant abuse, uncomplicated: Secondary | ICD-10-CM | POA: Diagnosis not present

## 2022-08-01 DIAGNOSIS — R69 Illness, unspecified: Secondary | ICD-10-CM | POA: Diagnosis not present

## 2022-08-01 DIAGNOSIS — Z79899 Other long term (current) drug therapy: Secondary | ICD-10-CM | POA: Diagnosis not present

## 2022-08-01 DIAGNOSIS — F1510 Other stimulant abuse, uncomplicated: Secondary | ICD-10-CM | POA: Diagnosis not present

## 2022-08-02 DIAGNOSIS — R69 Illness, unspecified: Secondary | ICD-10-CM | POA: Diagnosis not present

## 2022-08-02 DIAGNOSIS — F1510 Other stimulant abuse, uncomplicated: Secondary | ICD-10-CM | POA: Diagnosis not present

## 2022-08-03 DIAGNOSIS — R69 Illness, unspecified: Secondary | ICD-10-CM | POA: Diagnosis not present

## 2022-08-03 DIAGNOSIS — F1510 Other stimulant abuse, uncomplicated: Secondary | ICD-10-CM | POA: Diagnosis not present

## 2022-08-04 DIAGNOSIS — F1510 Other stimulant abuse, uncomplicated: Secondary | ICD-10-CM | POA: Diagnosis not present

## 2022-08-04 DIAGNOSIS — R69 Illness, unspecified: Secondary | ICD-10-CM | POA: Diagnosis not present

## 2022-08-05 DIAGNOSIS — F1510 Other stimulant abuse, uncomplicated: Secondary | ICD-10-CM | POA: Diagnosis not present

## 2022-08-05 DIAGNOSIS — R69 Illness, unspecified: Secondary | ICD-10-CM | POA: Diagnosis not present

## 2022-08-06 DIAGNOSIS — R69 Illness, unspecified: Secondary | ICD-10-CM | POA: Diagnosis not present

## 2022-08-06 DIAGNOSIS — F1510 Other stimulant abuse, uncomplicated: Secondary | ICD-10-CM | POA: Diagnosis not present

## 2022-08-07 DIAGNOSIS — R69 Illness, unspecified: Secondary | ICD-10-CM | POA: Diagnosis not present

## 2022-08-07 DIAGNOSIS — F1510 Other stimulant abuse, uncomplicated: Secondary | ICD-10-CM | POA: Diagnosis not present

## 2022-08-07 DIAGNOSIS — Z79899 Other long term (current) drug therapy: Secondary | ICD-10-CM | POA: Diagnosis not present

## 2022-08-08 DIAGNOSIS — F1510 Other stimulant abuse, uncomplicated: Secondary | ICD-10-CM | POA: Diagnosis not present

## 2022-08-08 DIAGNOSIS — R69 Illness, unspecified: Secondary | ICD-10-CM | POA: Diagnosis not present

## 2022-08-12 DIAGNOSIS — F1510 Other stimulant abuse, uncomplicated: Secondary | ICD-10-CM | POA: Diagnosis not present

## 2022-08-12 DIAGNOSIS — R69 Illness, unspecified: Secondary | ICD-10-CM | POA: Diagnosis not present

## 2022-08-14 DIAGNOSIS — F1510 Other stimulant abuse, uncomplicated: Secondary | ICD-10-CM | POA: Diagnosis not present

## 2022-08-14 DIAGNOSIS — R69 Illness, unspecified: Secondary | ICD-10-CM | POA: Diagnosis not present

## 2022-08-15 DIAGNOSIS — F1510 Other stimulant abuse, uncomplicated: Secondary | ICD-10-CM | POA: Diagnosis not present

## 2022-08-15 DIAGNOSIS — Z79899 Other long term (current) drug therapy: Secondary | ICD-10-CM | POA: Diagnosis not present

## 2022-08-15 DIAGNOSIS — R69 Illness, unspecified: Secondary | ICD-10-CM | POA: Diagnosis not present

## 2022-08-19 DIAGNOSIS — R69 Illness, unspecified: Secondary | ICD-10-CM | POA: Diagnosis not present

## 2022-08-19 DIAGNOSIS — F1510 Other stimulant abuse, uncomplicated: Secondary | ICD-10-CM | POA: Diagnosis not present

## 2022-08-19 DIAGNOSIS — Z79899 Other long term (current) drug therapy: Secondary | ICD-10-CM | POA: Diagnosis not present

## 2022-08-21 DIAGNOSIS — F1120 Opioid dependence, uncomplicated: Secondary | ICD-10-CM | POA: Diagnosis not present

## 2022-08-21 DIAGNOSIS — F1510 Other stimulant abuse, uncomplicated: Secondary | ICD-10-CM | POA: Diagnosis not present
